# Patient Record
Sex: Female | Born: 1935
Health system: Southern US, Community
[De-identification: ages and names within clinical notes are randomized; demographics above are authoritative.]

## PROBLEM LIST (undated history)

## (undated) DIAGNOSIS — I5032 Chronic diastolic (congestive) heart failure: Secondary | ICD-10-CM

## (undated) DIAGNOSIS — E049 Nontoxic goiter, unspecified: Secondary | ICD-10-CM

## (undated) DIAGNOSIS — K2100 Gastro-esophageal reflux disease with esophagitis, without bleeding: Secondary | ICD-10-CM

## (undated) DIAGNOSIS — R0602 Shortness of breath: Secondary | ICD-10-CM

## (undated) DIAGNOSIS — I10 Essential (primary) hypertension: Secondary | ICD-10-CM

## (undated) DIAGNOSIS — K56609 Unspecified intestinal obstruction, unspecified as to partial versus complete obstruction: Secondary | ICD-10-CM

## (undated) DIAGNOSIS — R5383 Other fatigue: Secondary | ICD-10-CM

## (undated) DIAGNOSIS — I509 Heart failure, unspecified: Secondary | ICD-10-CM

## (undated) HISTORY — DX: Unspecified intestinal obstruction, unspecified as to partial versus complete obstruction: K56.609

## (undated) HISTORY — DX: Other fatigue: R53.83

## (undated) HISTORY — DX: Nontoxic goiter, unspecified: E04.9

## (undated) HISTORY — PX: THYROIDECTOMY: SHX17

## (undated) HISTORY — PX: HERNIA REPAIR: SHX51

## (undated) HISTORY — DX: Gastro-esophageal reflux disease with esophagitis, without bleeding: K21.00

## (undated) HISTORY — DX: Essential (primary) hypertension: I10

## (undated) HISTORY — PX: APPENDECTOMY: SHX54

## (undated) HISTORY — PX: CATARACT EXTRACTION, BILATERAL: SHX1313

## (undated) HISTORY — DX: Shortness of breath: R06.02

## (undated) HISTORY — DX: Heart failure, unspecified: I50.9

## (undated) HISTORY — PX: SMALL INTESTINE SURGERY: SHX150

---
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
## 2007-01-04 ENCOUNTER — Emergency Department (HOSPITAL_COMMUNITY): Admission: EM | Admit: 2007-01-04 | Discharge: 2007-01-04 | Payer: Self-pay | Admitting: Emergency Medicine

## 2007-01-05 ENCOUNTER — Inpatient Hospital Stay (HOSPITAL_COMMUNITY): Admission: EM | Admit: 2007-01-05 | Discharge: 2007-01-09 | Payer: Self-pay | Admitting: Emergency Medicine

## 2010-11-04 ENCOUNTER — Other Ambulatory Visit: Payer: Self-pay | Admitting: Ophthalmology

## 2010-11-04 ENCOUNTER — Encounter (HOSPITAL_COMMUNITY): Payer: Medicare Other

## 2010-11-04 LAB — HEMOGLOBIN AND HEMATOCRIT, BLOOD
HCT: 43.1 % (ref 36.0–46.0)
Hemoglobin: 14.3 g/dL (ref 12.0–15.0)

## 2010-11-04 LAB — BASIC METABOLIC PANEL
BUN: 8 mg/dL (ref 6–23)
Calcium: 9.7 mg/dL (ref 8.4–10.5)
GFR calc non Af Amer: 59 mL/min — ABNORMAL LOW (ref >60)
Glucose, Bld: 122 mg/dL — ABNORMAL HIGH (ref 70–99)
Sodium: 138 mEq/L (ref 135–145)

## 2010-11-11 ENCOUNTER — Ambulatory Visit (HOSPITAL_COMMUNITY)
Admission: RE | Admit: 2010-11-11 | Discharge: 2010-11-11 | Disposition: A | Discharge status: Home / Self Care | Payer: Medicare Other | Source: Ambulatory Visit | Attending: Ophthalmology | Admitting: Ophthalmology

## 2010-11-11 DIAGNOSIS — Z01818 Encounter for other preprocedural examination: Secondary | ICD-10-CM | POA: Insufficient documentation

## 2010-11-11 DIAGNOSIS — Z01812 Encounter for preprocedural laboratory examination: Secondary | ICD-10-CM | POA: Insufficient documentation

## 2010-11-11 DIAGNOSIS — H2510 Age-related nuclear cataract, unspecified eye: Secondary | ICD-10-CM | POA: Insufficient documentation

## 2011-01-14 ENCOUNTER — Encounter (HOSPITAL_COMMUNITY): Payer: Medicare Other

## 2011-01-14 ENCOUNTER — Other Ambulatory Visit: Payer: Self-pay | Admitting: Ophthalmology

## 2011-01-14 LAB — DIFFERENTIAL
Basophils Absolute: 0.1 10*3/uL (ref 0.0–0.1)
Basophils Relative: 1 % (ref 0–1)
Neutro Abs: 2.9 10*3/uL (ref 1.7–7.7)
Neutrophils Relative %: 43 % (ref 43–77)

## 2011-01-14 LAB — CBC
Hemoglobin: 13.3 g/dL (ref 12.0–15.0)
MCHC: 33 g/dL (ref 30.0–36.0)
RDW: 15.1 % (ref 11.5–15.5)
WBC: 6.8 10*3/uL (ref 4.0–10.5)

## 2011-01-14 LAB — BASIC METABOLIC PANEL
GFR calc non Af Amer: 53 mL/min — ABNORMAL LOW (ref >60)
Potassium: 4.2 mEq/L (ref 3.5–5.1)
Sodium: 139 mEq/L (ref 135–145)

## 2011-01-20 ENCOUNTER — Ambulatory Visit (HOSPITAL_COMMUNITY)
Admission: RE | Admit: 2011-01-20 | Discharge: 2011-01-20 | Disposition: A | Discharge status: Home / Self Care | Payer: Medicare Other | Source: Ambulatory Visit | Attending: Ophthalmology | Admitting: Ophthalmology

## 2011-01-20 DIAGNOSIS — H2510 Age-related nuclear cataract, unspecified eye: Secondary | ICD-10-CM | POA: Insufficient documentation

## 2011-01-20 DIAGNOSIS — I10 Essential (primary) hypertension: Secondary | ICD-10-CM | POA: Insufficient documentation

## 2011-01-20 DIAGNOSIS — Z79899 Other long term (current) drug therapy: Secondary | ICD-10-CM | POA: Insufficient documentation

## 2015-07-05 ENCOUNTER — Ambulatory Visit (HOSPITAL_COMMUNITY)
Admission: RE | Admit: 2015-07-05 | Discharge: 2015-07-05 | Disposition: A | Discharge status: Home / Self Care | Payer: Medicare PPO | Source: Ambulatory Visit | Attending: Pulmonary Disease | Admitting: Pulmonary Disease

## 2015-07-05 ENCOUNTER — Other Ambulatory Visit (HOSPITAL_COMMUNITY): Payer: Self-pay | Admitting: Pulmonary Disease

## 2015-07-05 DIAGNOSIS — R05 Cough: Secondary | ICD-10-CM

## 2015-07-05 DIAGNOSIS — R059 Cough, unspecified: Secondary | ICD-10-CM

## 2015-07-05 DIAGNOSIS — Z87891 Personal history of nicotine dependence: Secondary | ICD-10-CM | POA: Insufficient documentation

## 2015-07-05 DIAGNOSIS — J9811 Atelectasis: Secondary | ICD-10-CM | POA: Diagnosis not present

## 2015-07-05 DIAGNOSIS — R0602 Shortness of breath: Secondary | ICD-10-CM | POA: Diagnosis not present

## 2015-07-05 DIAGNOSIS — R5383 Other fatigue: Secondary | ICD-10-CM | POA: Insufficient documentation

## 2015-07-16 ENCOUNTER — Ambulatory Visit (INDEPENDENT_AMBULATORY_CARE_PROVIDER_SITE_OTHER): Payer: Medicare PPO | Admitting: Cardiology

## 2015-07-16 ENCOUNTER — Encounter: Payer: Self-pay | Admitting: Cardiology

## 2015-07-16 VITALS — BP 116/74 | HR 83 | Ht 65.0 in | Wt 186.0 lb

## 2015-07-16 DIAGNOSIS — R0602 Shortness of breath: Secondary | ICD-10-CM

## 2015-07-16 DIAGNOSIS — Z23 Encounter for immunization: Secondary | ICD-10-CM

## 2015-07-16 DIAGNOSIS — R011 Cardiac murmur, unspecified: Secondary | ICD-10-CM | POA: Diagnosis not present

## 2015-07-16 MED ORDER — AMLODIPINE BESYLATE 10 MG PO TABS: 10.0000 mg | ORAL_TABLET | Freq: Every day | ORAL | Status: DC

## 2015-07-16 NOTE — Progress Notes (Signed)
Patient ID: Susan Beck, female   DOB: March 02, 1932, 81 y.o.   MRN: [id number]     Clinical Summary Susan Beck is a 81 y.o.female seen today as a new patient for the following medical problems.  1. SOB - notes overall decrease in energy level along with SOB/DOE with acitivites - started approx 6 months ago. Denies any chest pain. Notes some occasional palpitations, lasts just a few minutes. Can occur a few times a month. No LE edema. Notes some SOB with laying flat. -DOE at 1/2 block on level ground. More significant symptoms walking incline - prior history of tobacco abuse x 40 years, quit in 2000  2. Bradycardia - heart rate 48 by ekg in pcp office - she has been on verapamil for several years - no lgihtheadness or dizziness  3. OSA screen - + snoring, no reported apneic episodes. + daytime somnolence.    PMH 1. HTN   No Known Allergies   Current Outpatient Prescriptions  Medication Sig Dispense Refill  . omeprazole (PRILOSEC) 20 MG capsule Take 20 mg by mouth daily.     . verapamil (CALAN-SR) 180 MG CR tablet 180 mg 2 (two) times daily.      No current facility-administered medications for this visit.     No past surgical history on file.   No Known Allergies    Denies family history of heart disease   Social History + tobacco history x 40 years, quite around 2000. No EtoH or drug use  Review of Systems CONSTITUTIONAL: No weight loss, fever, chills, weakness or fatigue.  HEENT: Eyes: No visual loss, blurred vision, double vision or yellow sclerae.No hearing loss, sneezing, congestion, runny nose or sore throat.  SKIN: No rash or itching.  CARDIOVASCULAR: per HPI RESPIRATORY: No  cough or sputum.  GASTROINTESTINAL: No anorexia, nausea, vomiting or diarrhea. No abdominal pain or blood.  GENITOURINARY: No burning on urination, no polyuria NEUROLOGICAL: No headache, dizziness, syncope, paralysis, ataxia, numbness or tingling in the extremities. No change in  bowel or bladder control.  MUSCULOSKELETAL: No muscle, back pain, joint pain or stiffness.  LYMPHATICS: No enlarged nodes. No history of splenectomy.  PSYCHIATRIC: No history of depression or anxiety.  ENDOCRINOLOGIC: No reports of sweating, cold or heat intolerance. No polyuria or polydipsia.  Marland Kitchen   Physical Examination Filed Vitals:   07/16/15 0839  BP: 116/74  Pulse: 83   Filed Vitals:   07/16/15 0839  Height: 5\' 5"  (1.651 m)  Weight: 186 lb (84.369 kg)    Gen: resting comfortably, no acute distress HEENT: no scleral icterus, pupils equal round and reactive, no palptable cervical adenopathy,  CV: RRR, 2/6 systolic murmur, no jvd Resp: Clear to auscultation bilaterally GI: abdomen is soft, non-tender, non-distended, normal bowel sounds, no hepatosplenomegaly MSK: extremities are warm, no edema.  Skin: warm, no rash Neuro:  no focal deficits Psych: appropriate affect     Assessment and Plan  1. SOB - overall generalized fatigue along with DOE with activities - in setting of heart murmur and symptom will obtian echo - sinus brady to 48 by ekg done at her pcp office, normal heart rate here. We will change verapamil to norvasc 10mg  daily and follow symptoms.  - long tobacco history, if negative cardiac workup consider PFTs  2. HTN - change verapamil to norvasc due to bradycardia.       Arnoldo Lenis, M.D

## 2015-07-16 NOTE — Patient Instructions (Signed)
Medication Instructions:  STOP VERAPAMIL START NORVASC 10 MG DAILY  Labwork: NONE  Testing/Procedures: Your physician has requested that you have an echocardiogram. Echocardiography is a painless test that uses sound waves to create images of your heart. It provides your doctor with information about the size and shape of your heart and how well your heart's chambers and valves are working. This procedure takes approximately one hour. There are no restrictions for this procedure.    Follow-Up: Your physician recommends that you schedule a follow-up appointment in: [place] DR. BRANCH   Any Other Special Instructions Will Be Listed Below (If Applicable).     If you need a refill on your cardiac medications before your next appointment, please call your pharmacy.  Thanks for choosing [facility]!!!

## 2015-07-19 ENCOUNTER — Ambulatory Visit (HOSPITAL_COMMUNITY)
Admission: RE | Admit: 2015-07-19 | Discharge: 2015-07-19 | Disposition: A | Discharge status: Home / Self Care | Payer: Medicare PPO | Source: Ambulatory Visit | Attending: Cardiology | Admitting: Cardiology

## 2015-07-19 DIAGNOSIS — R011 Cardiac murmur, unspecified: Secondary | ICD-10-CM | POA: Diagnosis not present

## 2015-07-19 DIAGNOSIS — I10 Essential (primary) hypertension: Secondary | ICD-10-CM | POA: Diagnosis not present

## 2015-07-19 DIAGNOSIS — I358 Other nonrheumatic aortic valve disorders: Secondary | ICD-10-CM | POA: Diagnosis not present

## 2015-07-19 DIAGNOSIS — I517 Cardiomegaly: Secondary | ICD-10-CM | POA: Diagnosis not present

## 2015-07-23 ENCOUNTER — Telehealth: Payer: Self-pay | Admitting: *Deleted

## 2015-07-23 NOTE — Telephone Encounter (Signed)
Pt aware, confirmed 12/19 appt in [place]

## 2015-08-19 ENCOUNTER — Ambulatory Visit (INDEPENDENT_AMBULATORY_CARE_PROVIDER_SITE_OTHER): Payer: Medicare PPO | Admitting: Cardiology

## 2015-08-19 ENCOUNTER — Encounter: Payer: Self-pay | Admitting: Cardiology

## 2015-08-19 VITALS — BP 138/78 | HR 91 | Ht 65.0 in | Wt 187.0 lb

## 2015-08-19 DIAGNOSIS — I10 Essential (primary) hypertension: Secondary | ICD-10-CM | POA: Diagnosis not present

## 2015-08-19 DIAGNOSIS — R001 Bradycardia, unspecified: Secondary | ICD-10-CM

## 2015-08-19 DIAGNOSIS — R0602 Shortness of breath: Secondary | ICD-10-CM | POA: Diagnosis not present

## 2015-08-19 NOTE — Progress Notes (Signed)
Patient ID: Susan Beck, female   DOB: Apr 09, 1932, 81 y.o.   MRN: [id number]     Clinical Summary Susan Beck is a 81 y.o.female seen today for follow up of the following medical problems.   1. SOB -last visit she described overall decrease in energy level along with SOB/DOE with acitivites - started approx 6 months ago. Denies any chest pain. Notes some occasional palpitations, lasts just a few minutes. Can occur a few times a month. No LE edema. Notes some SOB with laying flat. -DOE at 1/2 block on level ground. More significant symptoms walking incline - prior history of tobacco abuse x 40 years, quit in 2000. Do not see PFTs in our system  - since last visit she completed an echo that showed normal LVEF, mild diastolic dysfunction, and no significant valve pathology.  - symptoms somewhat improved since last visit, but over the last 3-4 weeks has been battling significant sinus congestion.     2. Bradycardia - heart rate 79 by ekg in pcp office last visit, we changed her verapamil to norvasc.  - heart rates normalized in clinic today.    PMH 1. HTN    No Known Allergies   Current Outpatient Prescriptions  Medication Sig Dispense Refill  . amLODipine (NORVASC) 10 MG tablet Take 1 tablet (10 mg total) by mouth daily. 180 tablet 3  . omeprazole (PRILOSEC) 20 MG capsule Take 20 mg by mouth daily.      No current facility-administered medications for this visit.     No past surgical history on file.   No Known Allergies    Denies any family history of heart disease   Social History Susan Beck reports that she quit smoking about 79 years ago. Her smoking use included Cigarettes. She started smoking about 61 years ago. She smoked 1.00 pack per day. She has never used smokeless tobacco. Susan Beck has no alcohol history on file.   Review of Systems CONSTITUTIONAL: No weight loss, fever, chills, weakness or fatigue.  HEENT: Eyes: No visual loss, blurred  vision, double vision or yellow sclerae.No hearing loss, sneezing, congestion, runny nose or sore throat.  SKIN: No rash or itching.  CARDIOVASCULAR: no chest pain, no palpitiatoins.  RESPIRATORY: +SOB  GASTROINTESTINAL: No anorexia, nausea, vomiting or diarrhea. No abdominal pain or blood.  GENITOURINARY: No burning on urination, no polyuria NEUROLOGICAL: No headache, dizziness, syncope, paralysis, ataxia, numbness or tingling in the extremities. No change in bowel or bladder control.  MUSCULOSKELETAL: No muscle, back pain, joint pain or stiffness.  LYMPHATICS: No enlarged nodes. No history of splenectomy.  PSYCHIATRIC: No history of depression or anxiety.  ENDOCRINOLOGIC: No reports of sweating, cold or heat intolerance. No polyuria or polydipsia.  Marland Kitchen   Physical Examination Filed Vitals:   08/19/15 1038  BP: 138/78  Pulse: 91   Filed Vitals:   08/19/15 1038  Height: 5\' 5"  (1.651 m)  Weight: 187 lb (84.823 kg)    Gen: resting comfortably, no acute distress HEENT: no scleral icterus, pupils equal round and reactive, no palptable cervical adenopathy,  CV: RRR, 2/6 systolic murmur RUSB, no jvd Resp: Clear to auscultation bilaterally GI: abdomen is soft, non-tender, non-distended, normal bowel sounds, no hepatosplenomegaly MSK: extremities are warm, no edema.  Skin: warm, no rash Neuro:  no focal deficits Psych: appropriate affect   Diagnostic Studies 07/2015 echo Study Conclusions  - Left ventricle: The cavity size was normal. Wall thickness was increased in a pattern of mild LVH. Systolic function  was normal. The estimated ejection fraction was in the range of 60% to 65%. Wall motion was normal; there were no regional wall motion abnormalities. Doppler parameters are consistent with abnormal left ventricular relaxation (grade 1 diastolic dysfunction). Indeterminate filling pressures. - Aortic valve: Aortic valve sclerosis without stenosis.  Mildly thickened, mildly calcified leaflets. - Left atrium: The atrium was mildly dilated. Volume/bsa, ES, (1-plane Simpson&'s, A2C): 30 ml/m^2.    Assessment and Plan  1. SOB - overall generalized fatigue along with DOE with activities - fairly normal echo. Given her tobacco history I would consider possible PFTs once her sinus issues have resolved, I think this is more likely than CAD as the etiology, however if PFTs are normal would consider stress testing - she is to call us when sinus issues improved so we can order PFTs  2. HTN - change verapamil to norvasc due to bradycardia last visit - bp and heart rates look good  3. Bradycardia - resolved off verapamil, continue norvasc for HTN  4. Sinus congestion - ongoing x 3-4 weeks, recommended trial of claritiin    F/u 3 months    Arnoldo Lenis, M.D

## 2015-08-19 NOTE — Patient Instructions (Signed)
Medication Instructions:  START OTC CLARITIN 10 MG DAILY AS NEEDED FOR CONGESTION   Labwork: NONE  Testing/Procedures: NONE  Follow-Up: Your physician recommends that you schedule a follow-up appointment in: 3 MONTHS WITH DR. BRANCH   Any Other Special Instructions Will Be Listed Below (If Applicable).     If you need a refill on your cardiac medications before your next appointment, please call your pharmacy.

## 2015-11-21 ENCOUNTER — Ambulatory Visit (INDEPENDENT_AMBULATORY_CARE_PROVIDER_SITE_OTHER): Payer: Medicare Other | Admitting: Cardiology

## 2015-11-21 ENCOUNTER — Encounter: Payer: Self-pay | Admitting: Cardiology

## 2015-11-21 VITALS — BP 136/70 | HR 92 | Ht 65.0 in | Wt 190.0 lb

## 2015-11-21 DIAGNOSIS — R0602 Shortness of breath: Secondary | ICD-10-CM | POA: Diagnosis not present

## 2015-11-21 DIAGNOSIS — R252 Cramp and spasm: Secondary | ICD-10-CM | POA: Diagnosis not present

## 2015-11-21 DIAGNOSIS — I10 Essential (primary) hypertension: Secondary | ICD-10-CM | POA: Diagnosis not present

## 2015-11-21 DIAGNOSIS — Z79899 Other long term (current) drug therapy: Secondary | ICD-10-CM | POA: Diagnosis not present

## 2015-11-21 LAB — MAGNESIUM: MAGNESIUM: 2.2 mg/dL (ref 1.5–2.5)

## 2015-11-21 LAB — BASIC METABOLIC PANEL
BUN: 9 mg/dL (ref 7–25)
CALCIUM: 9.9 mg/dL (ref 8.6–10.4)
CO2: 27 mmol/L (ref 20–31)
CREATININE: 0.86 mg/dL (ref 0.60–0.88)
Chloride: 104 mmol/L (ref 98–110)
GLUCOSE: 101 mg/dL — AB (ref 65–99)
Potassium: 4.4 mmol/L (ref 3.5–5.3)
SODIUM: 138 mmol/L (ref 135–146)

## 2015-11-21 NOTE — Progress Notes (Signed)
Patient ID: DAVIELLE HUTCHCRAFT, female   DOB: [birth year], 82 y.o.   MRN: [id number]     Clinical Summary Ms. Hottinger is a 82 y.o.female seen today for follow up of the following medical problems.   1. SOB -last visit she described overall decrease in energy level along with SOB/DOE with acitivites - started approx 6 months ago. Denies any chest pain. Notes some occasional palpitations, lasts just a few minutes. Can occur a few times a month. No LE edema. Notes some SOB with laying flat. -DOE at 1/2 block on level ground. More significant symptoms walking incline - prior history of tobacco abuse x 40 years, quit in 2000. Do not see PFTs in our system  - since last visit she completed an echo that showed normal LVEF, mild diastolic dysfunction, and no significant valve pathology.  - still with SOB with activity. Can have some occasional wheezing.    2. Dizziness - episode on Sunday morning, occurred with standing. Multiple episodes that day - reports normal oral intake. No other symptoms  4. Leg cramps - can occur in either calf, typically occurs while laying in bed. Does not occur with walking.    5. Leg swelling - bilateral LE edema started since starting norvasc  PMH 1. HTN    No Known Allergies   Current Outpatient Prescriptions  Medication Sig Dispense Refill  . amLODipine (NORVASC) 10 MG tablet Take 1 tablet (10 mg total) by mouth daily. 180 tablet 3  . omeprazole (PRILOSEC) 20 MG capsule Take 20 mg by mouth daily. Reported on 08/19/2015     No current facility-administered medications for this visit.       No Known Allergies    Family History She denies any family history of heart disease   Social History Ms. Woodlief reports that she quit smoking about 16 years ago. Her smoking use included Cigarettes. She started smoking about 61 years ago. She smoked 1.00 pack per day. She has never used smokeless tobacco. Ms. Bunce has no alcohol history on  file.   Review of Systems CONSTITUTIONAL: No weight loss, fever, chills, weakness or fatigue.  HEENT: Eyes: No visual loss, blurred vision, double vision or yellow sclerae.No hearing loss, sneezing, congestion, runny nose or sore throat.  SKIN: No rash or itching.  CARDIOVASCULAR: RRR, no m/r/g, no jvd RESPIRATORY: No shortness of breath, cough or sputum.  GASTROINTESTINAL: No anorexia, nausea, vomiting or diarrhea. No abdominal pain or blood.  GENITOURINARY: No burning on urination, no polyuria NEUROLOGICAL: No headache, dizziness, syncope, paralysis, ataxia, numbness or tingling in the extremities. No change in bowel or bladder control.  MUSCULOSKELETAL: leg cramps LYMPHATICS: No enlarged nodes. No history of splenectomy.  PSYCHIATRIC: No history of depression or anxiety.  ENDOCRINOLOGIC: No reports of sweating, cold or heat intolerance. No polyuria or polydipsia.  Marland Kitchen   Physical Examination Filed Vitals:   11/21/15 0947  BP: 136/70  Pulse: 92   Filed Vitals:   11/21/15 0947  Height: 5\' 5"  (1.651 m)  Weight: 190 lb (86.183 kg)    Gen: resting comfortably, no acute distress HEENT: no scleral icterus, pupils equal round and reactive, no palptable cervical adenopathy,  CV: RRR, no m/r/g, no jvd Resp: Clear to auscultation bilaterally GI: abdomen is soft, non-tender, non-distended, normal bowel sounds, no hepatosplenomegaly MSK: extremities are warm, 1+ biltaeral Edema Skin: warm, no rash Neuro:  no focal deficits Psych: appropriate affect   Diagnostic Studies 07/2015 echo Study Conclusions  - Left ventricle: The cavity size  was normal. Wall thickness was increased in a pattern of mild LVH. Systolic function was normal. The estimated ejection fraction was in the range of 60% to 65%. Wall motion was normal; there were no regional wall motion abnormalities. Doppler parameters are consistent with abnormal left ventricular relaxation (grade 1 diastolic  dysfunction). Indeterminate filling pressures. - Aortic valve: Aortic valve sclerosis without stenosis. Mildly thickened, mildly calcified leaflets. - Left atrium: The atrium was mildly dilated. Volume/bsa, ES, (1-plane Simpson&'s, A2C): 30 ml/m^2.    Assessment and Plan  1. SOB - overall generalized fatigue along with DOE with activities - fairly normal echo. Given her tobacco history will order PFTs - if negative PFTs, consider stress testing  2. HTN - some LE edema since starting norvasc, also with some dizziness over the weekend with standing suggesting orthostatic hypotension, though orthostatic vials in clinic are negative - follow swelling and dizziness with norvasc decreased to 5mg  daily.    3. Leg cramps - check K and Mg    Arnoldo Lenis, M.D.

## 2015-11-21 NOTE — Patient Instructions (Signed)
Medication Instructions:  DECREASE NORVASC TO 5 MG DAILY   Labwork: Your physician recommends that you return for lab work [id number]   Testing/Procedures: Your physician has recommended that you have a pulmonary function test. Pulmonary Function Tests are a group of tests that measure how well air moves in and out of your lungs.    Follow-Up: Your physician recommends that you schedule a follow-up appointment in:  2 MONTHS    Any Other Special Instructions Will Be Listed Below (If Applicable).     If you need a refill on your cardiac medications before your next appointment, please call your pharmacy.

## 2015-11-29 ENCOUNTER — Ambulatory Visit (HOSPITAL_COMMUNITY)
Admission: RE | Admit: 2015-11-29 | Discharge: 2015-11-29 | Disposition: A | Discharge status: Home / Self Care | Payer: Medicare Other | Source: Ambulatory Visit | Attending: Cardiology | Admitting: Cardiology

## 2015-11-29 DIAGNOSIS — R0602 Shortness of breath: Secondary | ICD-10-CM | POA: Diagnosis present

## 2015-11-29 LAB — PULMONARY FUNCTION TEST
DL/VA % PRED: 83 %
DL/VA: 4.12 ml/min/mmHg/L
DLCO UNC % PRED: 56 %
DLCO UNC: 14.48 ml/min/mmHg
FEF 25-75 POST: 1.34 L/s
FEF 25-75 PRE: 1.38 L/s
FEF2575-%CHANGE-POST: -3 %
FEF2575-%PRED-POST: 100 %
FEF2575-%Pred-Pre: 103 %
FEV1-%CHANGE-POST: 1 %
FEV1-%Pred-Post: 94 %
FEV1-%Pred-Pre: 92 %
FEV1-Post: 1.52 L
FEV1-Pre: 1.49 L
FEV1FVC-%Change-Post: 0 %
FEV1FVC-%PRED-PRE: 105 %
FEV6-%Change-Post: 2 %
FEV6-%PRED-POST: 97 %
FEV6-%Pred-Pre: 95 %
FEV6-POST: 1.94 L
FEV6-Pre: 1.89 L
FEV6FVC-%PRED-POST: 104 %
FEV6FVC-%Pred-Pre: 104 %
FVC-%CHANGE-POST: 2 %
FVC-%PRED-POST: 93 %
FVC-%PRED-PRE: 91 %
FVC-PRE: 1.89 L
FVC-Post: 1.94 L
POST FEV1/FVC RATIO: 78 %
PRE FEV1/FVC RATIO: 79 %
PRE FEV6/FVC RATIO: 100 %
Post FEV6/FVC ratio: 100 %
RV % pred: 112 %
RV: 2.77 L
TLC % pred: 90 %
TLC: 4.69 L

## 2015-11-29 MED ORDER — ALBUTEROL SULFATE (2.5 MG/3ML) 0.083% IN NEBU
2.5000 mg | INHALATION_SOLUTION | Freq: Once | RESPIRATORY_TRACT | Status: AC
  Administered 2015-11-29: 2.5 mg via RESPIRATORY_TRACT

## 2016-01-24 ENCOUNTER — Encounter: Payer: Self-pay | Admitting: Cardiology

## 2016-01-24 ENCOUNTER — Ambulatory Visit (INDEPENDENT_AMBULATORY_CARE_PROVIDER_SITE_OTHER): Payer: Medicare Other | Admitting: Cardiology

## 2016-01-24 VITALS — BP 158/78 | HR 80 | Ht 66.0 in | Wt 187.0 lb

## 2016-01-24 DIAGNOSIS — R079 Chest pain, unspecified: Secondary | ICD-10-CM | POA: Diagnosis not present

## 2016-01-24 DIAGNOSIS — R0602 Shortness of breath: Secondary | ICD-10-CM

## 2016-01-24 DIAGNOSIS — I10 Essential (primary) hypertension: Secondary | ICD-10-CM | POA: Diagnosis not present

## 2016-01-24 NOTE — Progress Notes (Signed)
Patient ID: Susan Beck, female   DOB: 29-Oct-1931, 82 y.o.   MRN: [id number]     Clinical Summary Susan Beck is a 82 y.o.female seen today for follow up of the following medical problems.   1. SOB -last visit she described overall decrease in energy level along with SOB/DOE with acitivites - started approx 6 months ago. Denies any chest pain. Notes some occasional palpitations, lasts just a few minutes. Can occur a few times a month. No LE edema. Notes some SOB with laying flat. -DOE at 1/2 block on level ground. More significant symptoms walking incline - prior history of tobacco abuse x 40 years, quit in 2000. Do not see PFTs in our system  -  she completed an echo that showed normal LVEF, mild diastolic dysfunction, and no significant valve pathology. PFTs were normal.  - still with SOB with activity.  DOE at 1/2 block  2. HTN - norvasc 10mg  caused dizziness and LE edema. Symptoms improved after decreasing norvasc to 5mg  daily.     PMH 1. HTN 2. GERD   No Known Allergies   Current Outpatient Prescriptions  Medication Sig Dispense Refill  . amLODipine (NORVASC) 10 MG tablet Take 1 tablet (10 mg total) by mouth daily. (Patient taking differently: Take 5 mg by mouth daily. ) 180 tablet 3  . Probiotic Product ([place]) CAPS Take by mouth.     No current facility-administered medications for this visit.     No past surgical history on file.   No Known Allergies    No family history on file.   Social History Susan Beck reports that she quit smoking about 16 years ago. Her smoking use included Cigarettes. She started smoking about 61 years ago. She smoked 1.00 pack per day. She has never used smokeless tobacco. Susan Beck has no alcohol history on file.   Review of Systems CONSTITUTIONAL: No weight loss, fever, chills, weakness or fatigue.  HEENT: Eyes: No visual loss, blurred vision, double vision or yellow sclerae.No hearing loss, sneezing,  congestion, runny nose or sore throat.  SKIN: No rash or itching.  CARDIOVASCULAR: no chest pain RESPIRATORY: per HPI  GASTROINTESTINAL: No anorexia, nausea, vomiting or diarrhea. No abdominal pain or blood.  GENITOURINARY: No burning on urination, no polyuria NEUROLOGICAL: No headache, dizziness, syncope, paralysis, ataxia, numbness or tingling in the extremities. No change in bowel or bladder control.  MUSCULOSKELETAL: No muscle, back pain, joint pain or stiffness.  LYMPHATICS: No enlarged nodes. No history of splenectomy.  PSYCHIATRIC: No history of depression or anxiety.  ENDOCRINOLOGIC: No reports of sweating, cold or heat intolerance. No polyuria or polydipsia.  Susan Beck   Physical Examination Filed Vitals:   01/24/16 0939  BP: 158/78  Pulse: 80   Filed Vitals:   01/24/16 0939  Height: 5\' 6"  (1.676 m)  Weight: 187 lb (84.823 kg)    Gen: resting comfortably, no acute distress HEENT: no scleral icterus, pupils equal round and reactive, no palptable cervical adenopathy,  CV: RRR, no m/r/g, no jvd Resp: Clear to auscultation bilaterally GI: abdomen is soft, non-tender, non-distended, normal bowel sounds, no hepatosplenomegaly MSK: extremities are warm, trace bilateral edema Skin: warm, no rash Neuro:  no focal deficits Psych: appropriate affect   Diagnostic Studies 07/2015 echo Study Conclusions  - Left ventricle: The cavity size was normal. Wall thickness was increased in a pattern of mild LVH. Systolic function was normal. The estimated ejection fraction was in the range of 60% to 65%. Wall motion  was normal; there were no regional wall motion abnormalities. Doppler parameters are consistent with abnormal left ventricular relaxation (grade 1 diastolic dysfunction). Indeterminate filling pressures. - Aortic valve: Aortic valve sclerosis without stenosis. Mildly thickened, mildly calcified leaflets. - Left atrium: The atrium was mildly dilated. Volume/bsa,  ES, (1-plane Simpson&'s, A2C): 30 ml/m^2.  10/2015 PFTs Normal   Assessment and Plan   1. SOB - overall generalized fatigue along with DOE with activities -EKG in clinic shows NSR, no ischemic changes.  fairly normal echo. PFTs unremarkable. We will obtain an exercise nuclear stress test with modified Bruce protocol to evaluate for possible ischemia as the etiology of her SOB and DOE.   2. HTN - bp elevated, she had significant orthostatic symptoms with more aggressive therapy. We will continue current meds    F/u 3 months   Susan Beck, M.D.

## 2016-01-24 NOTE — Patient Instructions (Signed)
Medication Instructions:  Your physician recommends that you continue on your current medications as directed. Please refer to the Current Medication list given to you today.   Labwork: NONE  Testing/Procedures: Your physician has requested that you have en exercise stress myoview. For further information please visit [email or web address]. Please follow instruction sheet, as given.    Follow-Up: Your physician recommends that you schedule a follow-up appointment in: 3 MONTHS     Any Other Special Instructions Will Be Listed Below (If Applicable).     If you need a refill on your cardiac medications before your next appointment, please call your pharmacy.   

## 2016-01-29 ENCOUNTER — Encounter (HOSPITAL_COMMUNITY)
Admission: RE | Admit: 2016-01-29 | Discharge: 2016-01-29 | Disposition: A | Discharge status: Home / Self Care | Payer: Medicare Other | Source: Ambulatory Visit | Attending: Cardiology | Admitting: Cardiology

## 2016-01-29 ENCOUNTER — Encounter (HOSPITAL_COMMUNITY): Payer: Self-pay

## 2016-01-29 ENCOUNTER — Inpatient Hospital Stay (HOSPITAL_COMMUNITY): Admission: RE | Admit: 2016-01-29 | Payer: Medicare Other | Source: Ambulatory Visit

## 2016-01-29 DIAGNOSIS — R079 Chest pain, unspecified: Secondary | ICD-10-CM

## 2016-01-29 LAB — NM MYOCAR MULTI W/SPECT W/WALL MOTION / EF
CHL CUP MPHR: 139 {beats}/min
CHL CUP NUCLEAR SDS: 1
CHL CUP NUCLEAR SRS: 0
CHL CUP RESTING HR STRESS: 59 {beats}/min
CHL RATE OF PERCEIVED EXERTION: 13
CSEPED: 3 min
CSEPEDS: 47 s
CSEPEW: 2.3 METS
LV dias vol: 66 mL (ref 46–106)
LV sys vol: 20 mL
Peak HR: 129 {beats}/min
Percent HR: 92 %
RATE: 0.31
SSS: 1
TID: 0.9

## 2016-01-29 MED ORDER — TECHNETIUM TC 99M TETROFOSMIN IV KIT
30.0000 | PACK | Freq: Once | INTRAVENOUS | Status: AC | PRN
  Administered 2016-01-29: 30 via INTRAVENOUS

## 2016-01-29 MED ORDER — SODIUM CHLORIDE 0.9% FLUSH
INTRAVENOUS | Status: AC
  Administered 2016-01-29: 10 mL via INTRAVENOUS
  Filled 2016-01-29: qty 10

## 2016-01-29 MED ORDER — TECHNETIUM TC 99M TETROFOSMIN IV KIT
10.0000 | PACK | Freq: Once | INTRAVENOUS | Status: AC | PRN
  Administered 2016-01-29: 10 via INTRAVENOUS

## 2016-01-29 MED ORDER — REGADENOSON 0.4 MG/5ML IV SOLN
INTRAVENOUS | Status: AC
  Filled 2016-01-29: qty 5

## 2016-05-05 ENCOUNTER — Encounter: Payer: Self-pay | Admitting: Cardiology

## 2016-05-05 ENCOUNTER — Ambulatory Visit (INDEPENDENT_AMBULATORY_CARE_PROVIDER_SITE_OTHER): Payer: Medicare Other | Admitting: Cardiology

## 2016-05-05 VITALS — BP 144/70 | HR 68 | Ht 65.0 in | Wt 183.0 lb

## 2016-05-05 DIAGNOSIS — R0602 Shortness of breath: Secondary | ICD-10-CM

## 2016-05-05 NOTE — Patient Instructions (Signed)

## 2016-05-05 NOTE — Progress Notes (Signed)
Clinical Summary Susan Beck is a 82 y.o.female seen today for follow up of the following medical problems.   1. SOB -last visit she described overall decrease in energy level along with SOB/DOE with acitivites - started approx 6 months ago. Denies any chest pain. Notes some occasional palpitations, lasts just a few minutes. Can occur a few times a month. No LE edema. Notes some SOB with laying flat. -DOE at 1/2 block on level ground. More significant symptoms walking incline  -  she completed an echo that showed normal LVEF, mild diastolic dysfunction, and no significant valve pathology. PFTs were normal.  - since last visit completed exercise nuclear stress modified Bruce protocol. She went 4 min and 2.3 METs, achieving her THR. No evidence of ischemia.  - echo with LVEF 60-65%, no WMAs, grade I diastolic dysfunction.   - has started increasing her exercise activity. Walking 3-4 days a week, symptoms of SOB are improving with increased activity. .   2. HTN - norvasc 10mg  caused dizziness and LE edema. Symptoms improved after decreasing norvasc to 5mg  daily.     PMH 1. HTN    No Known Allergies   Current Outpatient Prescriptions  Medication Sig Dispense Refill  . amLODipine (NORVASC) 10 MG tablet Take 1 tablet (10 mg total) by mouth daily. (Patient taking differently: Take 5 mg by mouth daily. ) 180 tablet 3  . omeprazole (PRILOSEC) 20 MG capsule Take 20 mg by mouth daily.    . Probiotic Product ([place]) CAPS Take by mouth.     No current facility-administered medications for this visit.         No Known Allergies     Social History Susan Beck reports that she quit smoking about 16 years ago. Her smoking use included Cigarettes. She started smoking about 61 years ago. She smoked 1.00 pack per day. She has never used smokeless tobacco. Susan Beck has no alcohol history on file.   Review of Systems CONSTITUTIONAL: No weight loss,  fever, chills, weakness or fatigue.  HEENT: Eyes: No visual loss, blurred vision, double vision or yellow sclerae.No hearing loss, sneezing, congestion, runny nose or sore throat.  SKIN: No rash or itching.  CARDIOVASCULAR: per HPI RESPIRATORY: Per HPI GASTROINTESTINAL: No anorexia, nausea, vomiting or diarrhea. No abdominal pain or blood.  GENITOURINARY: No burning on urination, no polyuria NEUROLOGICAL: No headache, dizziness, syncope, paralysis, ataxia, numbness or tingling in the extremities. No change in bowel or bladder control.  MUSCULOSKELETAL: No muscle, back pain, joint pain or stiffness.  LYMPHATICS: No enlarged nodes. No history of splenectomy.  PSYCHIATRIC: No history of depression or anxiety.  ENDOCRINOLOGIC: No reports of sweating, cold or heat intolerance. No polyuria or polydipsia.  Marland Kitchen   Physical Examination Vitals:   05/05/16 0957  BP: (!) 144/70  Pulse: 68   Vitals:   05/05/16 0957  Weight: 183 lb (83 kg)  Height: 5\' 5"  (1.651 m)    Gen: resting comfortably, no acute distress HEENT: no scleral icterus, pupils equal round and reactive, no palptable cervical adenopathy,  CV: RRR, no jvd Resp: Clear to auscultation bilaterally GI: abdomen is soft, non-tender, non-distended, normal bowel sounds, no hepatosplenomegaly MSK: extremities are warm, no edema.  Skin: warm, no rash Neuro:  no focal deficits Psych: appropriate affect   Diagnostic Studies 07/2015 echo Study Conclusions  - Left ventricle: The cavity size was normal. Wall thickness was increased in a pattern of mild LVH. Systolic function was normal. The  estimated ejection fraction was in the range of 60% to 65%. Wall motion was normal; there were no regional wall motion abnormalities. Doppler parameters are consistent with abnormal left ventricular relaxation (grade 1 diastolic dysfunction). Indeterminate filling pressures. - Aortic valve: Aortic valve sclerosis without stenosis.  Mildly thickened, mildly calcified leaflets. - Left atrium: The atrium was mildly dilated. Volume/bsa, ES, (1-plane Simpson&'s, A2C): 30 ml/m^2.  10/2015 PFTs Normal   12/2015 MPI  Blood pressure demonstrated a hypertensive response to exercise.  There was no ST segment deviation noted during stress.  The study is normal. There are no perfusion defects.  This is a low risk study.  The left ventricular ejection fraction is hyperdynamic (>65%).   Assessment and Plan  1. SOB - overall generalized fatigue along with DOE with activities -negative workup including echo, PFTs, and stress test - symptoms improving with increased exercise/activity, likely main component is deconditioning. Encouraged to continue exercise program.    F/u 1 year      Arnoldo Lenis, M.D.

## 2016-08-11 ENCOUNTER — Other Ambulatory Visit: Payer: Self-pay | Admitting: Cardiology

## 2016-09-22 ENCOUNTER — Other Ambulatory Visit: Payer: Self-pay | Admitting: Cardiology

## 2016-09-22 MED ORDER — AMLODIPINE BESYLATE 5 MG PO TABS: 5.0000 mg | ORAL_TABLET | Freq: Every day | ORAL | 1 refills | Status: DC

## 2016-09-22 NOTE — Telephone Encounter (Signed)
Pt is needing a new Rx for her amLODipine (NORVASC) 5 MG tablet [id number]  sent to [facility]. They have her Rx for 10mg  and she's been having to cut them in half.

## 2016-09-22 NOTE — Telephone Encounter (Signed)
Clarified with Dr Harl Bowie about pt's dose of Norvasc.He wants her to have 5 mg tablets dispensed as that is her correct dose.e-scribed to [facility]   Attempted to reach pt to tell her this and got answering machine.

## 2016-09-23 NOTE — Telephone Encounter (Signed)
Patient notified of refill and voiced understanding

## 2017-03-31 ENCOUNTER — Other Ambulatory Visit: Payer: Self-pay | Admitting: Cardiology

## 2017-07-02 ENCOUNTER — Other Ambulatory Visit: Payer: Self-pay | Admitting: Cardiology

## 2017-10-04 ENCOUNTER — Other Ambulatory Visit: Payer: Self-pay | Admitting: Cardiology

## 2017-12-31 ENCOUNTER — Other Ambulatory Visit: Payer: Self-pay | Admitting: Cardiology

## 2018-01-12 ENCOUNTER — Other Ambulatory Visit (HOSPITAL_COMMUNITY): Payer: Self-pay | Admitting: Pulmonary Disease

## 2018-01-12 DIAGNOSIS — E049 Nontoxic goiter, unspecified: Secondary | ICD-10-CM

## 2018-01-18 ENCOUNTER — Ambulatory Visit (HOSPITAL_COMMUNITY)
Admission: RE | Admit: 2018-01-18 | Discharge: 2018-01-18 | Disposition: A | Discharge status: Home / Self Care | Payer: Medicare Other | Source: Ambulatory Visit | Attending: Pulmonary Disease | Admitting: Pulmonary Disease

## 2018-01-18 DIAGNOSIS — E890 Postprocedural hypothyroidism: Secondary | ICD-10-CM | POA: Insufficient documentation

## 2018-01-18 DIAGNOSIS — E049 Nontoxic goiter, unspecified: Secondary | ICD-10-CM

## 2018-01-18 DIAGNOSIS — E041 Nontoxic single thyroid nodule: Secondary | ICD-10-CM | POA: Diagnosis not present

## 2018-02-10 ENCOUNTER — Ambulatory Visit (INDEPENDENT_AMBULATORY_CARE_PROVIDER_SITE_OTHER): Payer: Medicare Other | Admitting: Otolaryngology

## 2018-02-10 DIAGNOSIS — H608X3 Other otitis externa, bilateral: Secondary | ICD-10-CM

## 2018-02-10 DIAGNOSIS — H903 Sensorineural hearing loss, bilateral: Secondary | ICD-10-CM

## 2019-01-17 LAB — LIPID PANEL
Cholesterol: 190 (ref 0–200)
HDL: 46 (ref 35–70)
LDL Cholesterol: 105
Triglycerides: 94 (ref 40–160)

## 2019-01-17 LAB — CBC AND DIFFERENTIAL
HCT: 44 (ref 36–46)
Hemoglobin: 15.2 (ref 12.0–16.0)
Neutrophils Absolute: 5
Platelets: 326 (ref 150–399)
WBC: 8.6

## 2019-01-17 LAB — BASIC METABOLIC PANEL
BUN: 10 (ref 4–21)
CO2: 24 — AB (ref 13–22)
Chloride: 102 (ref 99–108)
Creatinine: 0.9 (ref <=1.1)
Glucose: 109
Potassium: 4.3 (ref 3.4–5.3)
Sodium: 141 (ref 137–147)

## 2019-01-17 LAB — HEPATIC FUNCTION PANEL
ALT: 23 (ref 7–35)
AST: 21 (ref 13–35)
Alkaline Phosphatase: 66 (ref 25–125)

## 2019-01-17 LAB — COMPREHENSIVE METABOLIC PANEL
Albumin: 4.5 (ref 3.5–5.0)
Calcium: 10.2 (ref 8.7–10.7)
GFR calc Af Amer: 67
GFR calc non Af Amer: 58

## 2019-01-17 LAB — TSH: TSH: 2.49 (ref <=5.90)

## 2019-01-17 LAB — CBC: RBC: 5 (ref 3.87–5.11)

## 2019-01-18 ENCOUNTER — Other Ambulatory Visit: Payer: Self-pay | Admitting: Pulmonary Disease

## 2019-01-18 DIAGNOSIS — E041 Nontoxic single thyroid nodule: Secondary | ICD-10-CM

## 2019-01-25 ENCOUNTER — Ambulatory Visit (HOSPITAL_COMMUNITY)
Admission: RE | Admit: 2019-01-25 | Discharge: 2019-01-25 | Disposition: A | Discharge status: Home / Self Care | Payer: Medicare Other | Source: Ambulatory Visit | Attending: Pulmonary Disease | Admitting: Pulmonary Disease

## 2019-01-25 ENCOUNTER — Other Ambulatory Visit: Payer: Self-pay

## 2019-01-25 DIAGNOSIS — E041 Nontoxic single thyroid nodule: Secondary | ICD-10-CM | POA: Diagnosis not present

## 2019-02-16 ENCOUNTER — Ambulatory Visit (INDEPENDENT_AMBULATORY_CARE_PROVIDER_SITE_OTHER): Payer: Medicare Other | Admitting: Otolaryngology

## 2019-02-16 DIAGNOSIS — H903 Sensorineural hearing loss, bilateral: Secondary | ICD-10-CM

## 2019-07-29 DIAGNOSIS — E049 Nontoxic goiter, unspecified: Secondary | ICD-10-CM | POA: Insufficient documentation

## 2019-07-29 DIAGNOSIS — R5383 Other fatigue: Secondary | ICD-10-CM | POA: Insufficient documentation

## 2019-07-29 DIAGNOSIS — I10 Essential (primary) hypertension: Secondary | ICD-10-CM | POA: Insufficient documentation

## 2019-07-29 DIAGNOSIS — K2100 Gastro-esophageal reflux disease with esophagitis, without bleeding: Secondary | ICD-10-CM | POA: Insufficient documentation

## 2019-07-29 DIAGNOSIS — I152 Hypertension secondary to endocrine disorders: Secondary | ICD-10-CM | POA: Insufficient documentation

## 2019-07-29 DIAGNOSIS — I5032 Chronic diastolic (congestive) heart failure: Secondary | ICD-10-CM | POA: Insufficient documentation

## 2019-07-29 DIAGNOSIS — R0602 Shortness of breath: Secondary | ICD-10-CM | POA: Insufficient documentation

## 2019-09-01 DIAGNOSIS — Z923 Personal history of irradiation: Secondary | ICD-10-CM

## 2019-09-01 HISTORY — DX: Personal history of irradiation: Z92.3

## 2019-10-31 ENCOUNTER — Ambulatory Visit: Payer: Medicare PPO | Attending: Internal Medicine

## 2019-10-31 DIAGNOSIS — Z23 Encounter for immunization: Secondary | ICD-10-CM | POA: Insufficient documentation

## 2019-10-31 NOTE — Progress Notes (Signed)
Patient received Moderna COVID Vaccine  Lot number [id number].  Pfizer was documented in error.  

## 2019-11-28 ENCOUNTER — Ambulatory Visit: Payer: Medicare PPO | Attending: Internal Medicine

## 2019-11-28 DIAGNOSIS — Z23 Encounter for immunization: Secondary | ICD-10-CM

## 2019-11-28 NOTE — Progress Notes (Signed)
   Covid-19 Vaccination Clinic  Name:  Susan Beck    MRN: [id number] DOB: [birth year]  11/28/2019  [name] was observed post Covid-19 immunization for 15 minutes without incident. She was provided with Vaccine Information Sheet and instruction to access the V-Safe system.   [name] was instructed to call 911 with any severe reactions post vaccine: Marland Kitchen Difficulty breathing  . Swelling of face and throat  . A fast heartbeat  . A bad rash all over body  . Dizziness and weakness   Immunizations Administered    Name Date Dose VIS Date Route   Moderna COVID-19 Vaccine 11/28/2019 10:35 AM 0.5 mL 08/01/2019 Intramuscular   Manufacturer: Moderna   Lot: [id number]   [id number]

## 2020-01-03 ENCOUNTER — Ambulatory Visit: Payer: Medicare Other | Admitting: Family Medicine

## 2020-01-17 ENCOUNTER — Other Ambulatory Visit (HOSPITAL_COMMUNITY): Payer: Self-pay | Admitting: Internal Medicine

## 2020-01-17 DIAGNOSIS — N63 Unspecified lump in unspecified breast: Secondary | ICD-10-CM

## 2020-01-30 ENCOUNTER — Ambulatory Visit (HOSPITAL_COMMUNITY)
Admission: RE | Admit: 2020-01-30 | Discharge: 2020-01-30 | Disposition: A | Discharge status: Home / Self Care | Payer: Medicare PPO | Source: Ambulatory Visit | Attending: Internal Medicine | Admitting: Internal Medicine

## 2020-01-30 ENCOUNTER — Encounter (HOSPITAL_COMMUNITY): Payer: Medicare PPO

## 2020-01-30 ENCOUNTER — Other Ambulatory Visit: Payer: Self-pay

## 2020-01-30 DIAGNOSIS — N63 Unspecified lump in unspecified breast: Secondary | ICD-10-CM

## 2020-01-30 DIAGNOSIS — N6321 Unspecified lump in the left breast, upper outer quadrant: Secondary | ICD-10-CM | POA: Diagnosis not present

## 2020-01-31 ENCOUNTER — Other Ambulatory Visit (HOSPITAL_COMMUNITY): Payer: Self-pay | Admitting: Internal Medicine

## 2020-01-31 DIAGNOSIS — N6320 Unspecified lump in the left breast, unspecified quadrant: Secondary | ICD-10-CM

## 2020-02-13 ENCOUNTER — Ambulatory Visit (HOSPITAL_COMMUNITY)
Admission: RE | Admit: 2020-02-13 | Discharge: 2020-02-13 | Disposition: A | Discharge status: Home / Self Care | Payer: Medicare PPO | Source: Ambulatory Visit | Attending: Internal Medicine | Admitting: Internal Medicine

## 2020-02-13 ENCOUNTER — Other Ambulatory Visit: Payer: Self-pay

## 2020-02-13 ENCOUNTER — Other Ambulatory Visit (HOSPITAL_COMMUNITY): Payer: Self-pay | Admitting: Internal Medicine

## 2020-02-13 DIAGNOSIS — C50412 Malignant neoplasm of upper-outer quadrant of left female breast: Secondary | ICD-10-CM | POA: Diagnosis not present

## 2020-02-13 DIAGNOSIS — R928 Other abnormal and inconclusive findings on diagnostic imaging of breast: Secondary | ICD-10-CM

## 2020-02-13 DIAGNOSIS — N6320 Unspecified lump in the left breast, unspecified quadrant: Secondary | ICD-10-CM

## 2020-02-13 HISTORY — PX: BREAST BIOPSY: SHX20

## 2020-02-13 MED ORDER — LIDOCAINE HCL (PF) 2 % IJ SOLN
INTRAMUSCULAR | Status: AC
  Filled 2020-02-13: qty 20

## 2020-02-13 MED ORDER — LIDOCAINE-EPINEPHRINE (PF) 1 %-1:200000 IJ SOLN
INTRAMUSCULAR | Status: AC
  Filled 2020-02-13: qty 30

## 2020-02-29 ENCOUNTER — Encounter: Payer: Self-pay | Admitting: General Surgery

## 2020-02-29 ENCOUNTER — Ambulatory Visit: Payer: Medicare PPO | Admitting: General Surgery

## 2020-02-29 ENCOUNTER — Other Ambulatory Visit: Payer: Self-pay

## 2020-02-29 VITALS — BP 178/101 | HR 85 | Temp 97.6°F | Resp 18 | Ht 65.0 in | Wt 192.0 lb

## 2020-02-29 DIAGNOSIS — C50412 Malignant neoplasm of upper-outer quadrant of left female breast: Secondary | ICD-10-CM | POA: Diagnosis not present

## 2020-02-29 NOTE — Patient Instructions (Signed)
Total or Modified Radical Mastectomy, Care After This sheet gives you information about how to care for yourself after your procedure. Your health care provider may also give you more specific instructions. If you have problems or questions, contact your health care provider. What can I expect after the procedure? After the procedure, it is common to have:  Pain.  Numbness.  Stiffness in the arm or shoulder.  Feelings of stress, sadness, or depression. If the lymph nodes under your arm were removed, you may have arm swelling, weakness, or numbness on the same side of your body as your surgery. Follow these instructions at home: Incision care   Follow instructions from your health care provider about how to take care of your incision. Make sure you: ? Wash your hands with soap and water before you change your bandage (dressing). If soap and water are not available, use hand sanitizer. ? Change your dressing as told by your health care provider. ? Leave stitches (sutures), skin glue, or adhesive strips in place. These skin closures may need to stay in place for 2 weeks or longer. If adhesive strip edges start to loosen and curl up, you may trim the loose edges. Do not remove adhesive strips completely unless your health care provider tells you to do that.  Check your incision area every day for signs of infection. Check for: ? Redness, swelling, or more pain. ? Fluid or blood. ? Warmth. ? Pus or a bad smell.  If you were sent home with a surgical drain in place, follow instructions from your health care provider about emptying it. Bathing  Do not take baths, swim, or use a hot tub until your health care provider approves. Ask your health care provider if you may take showers. You may only be allowed to take sponge baths. Activity  Return to your normal activities as told by your health care provider. Ask your health care provider what activities are safe for you.  Avoid activities  that take a lot of effort.  Be careful to avoid any activities that could cause an injury to your arm on the side of your surgery.  Do not lift anything that is heavier than 10 lb (4.5 kg), or the limit that you are told, until your health care provider says that it is safe.  Avoid lifting with the arm on the side of your surgery.  Do not carry heavy objects on your shoulder.  After your drain is removed, do exercises to prevent stiffness and swelling in your arm. Talk with your health care provider about which exercises are safe for you. General instructions  Take over-the-counter and prescription medicines only as told by your health care provider.  You may eat what you usually do.  Keep your arm raised (elevated) above the level of your heart when you are sitting or lying down.  Do not wear tight jewelry on your arm, wrist, or fingers on the side of your surgery.  You may be given a tight sleeve (compression bandage) to wear over your arm on the side of your surgery. Wear this sleeve as told by your health care provider.  Ask your health care provider when you can start wearing a bra or using a breast prosthesis.  Before you are involved in certain procedures such as giving blood or having your blood pressure checked, tell all your health care providers if lymph nodes under your arm were removed. This is important information. Follow-up  Keep all follow-up visits  as told by your health care provider. This is important.  Get checked for extra fluid around your lymph nodes (lymphedema) as often as told by your health care provider. Contact a health care provider if:  You have a fever.  Your pain medicine is not working.  Your arm swelling, weakness, or numbness has not improved after a few weeks.  You have new swelling in your breast area or arm.  You have redness, swelling, or more pain in your incision area.  You have fluid or blood coming from your incision.  Your  incision feels warm to the touch.  You have pus or a bad smell coming from your incision. Get help right away if:  You have very bad pain in your breast area or arm.  You have chest pain.  You have difficulty breathing. Summary  Follow instructions from your health care provider about how to take care of your incision. Check your incision area every day for signs of infection.  Ask your health care provider what activities are safe for you.  Keep all follow-up visits as told by your health care provider. This is important.  Make sure you know which symptoms should cause you to contact your health care provider or to get help right away. This information is not intended to replace advice given to you by your health care provider. Make sure you discuss any questions you have with your health care provider. Document Revised: 10/21/2018 Document Reviewed: 05/21/2017 Elsevier Patient Education  2020 [employer].

## 2020-03-01 NOTE — Progress Notes (Signed)
Susan Beck; [id number]; [birth year]   HPI Patient is an 86 year old black female who was referred to my care by Dr. Legrand Rams in the breast center for newly diagnosed left lobular invasive carcinoma.  Patient noticed a lump in her left breast several months ago.  She underwent mammography and biopsy of 2 areas in the left breast.  Both were positive for lobular carcinoma.  A biopsy of the left axillary lymph node was negative.  Patient states she has a sister with breast cancer, diagnosed in her 18s.  She denies any left breast pain.  No nipple discharge has been noted. Patient reports some shortness of breath when walking a short distance from an office to the parking lot.  She denies any chest pain. Past Medical History:  Diagnosis Date   Bowel obstruction (HCC)    Chronic diastolic (congestive) heart failure (HCC)    Essential (primary) hypertension    Gastro-esophageal reflux disease with esophagitis    Nontoxic goiter, unspecified    Other fatigue    Shortness of breath     Past Surgical History:  Procedure Laterality Date   APPENDECTOMY     HERNIA REPAIR      History reviewed. No pertinent family history.  Current Outpatient Medications on File Prior to Visit  Medication Sig Dispense Refill   amLODipine (NORVASC) 5 MG tablet TAKE ONE TABLET BY MOUTH ONCE DAILY. 30 tablet 0   omeprazole (PRILOSEC) 20 MG capsule Take 20 mg by mouth daily.     Probiotic Product ([place]) CAPS Take by mouth.     No current facility-administered medications on file prior to visit.    No Known Allergies  Social History   Substance and Sexual Activity  Alcohol Use None    Social History   Tobacco Use  Smoking Status Former Smoker   Packs/day: 1.00   Types: Cigarettes   Start date: 07/15/1954   Quit date: 07/16/1999   Years since quitting: 20.6  Smokeless Tobacco Never Used    Review of Systems  Constitutional: Negative.   HENT: Negative.   Eyes:  Negative.   Respiratory: Positive for shortness of breath.   Cardiovascular: Negative.   Gastrointestinal: Negative.   Genitourinary: Positive for frequency.  Musculoskeletal: Negative.   Skin: Negative.   Neurological: Negative.   Endo/Heme/Allergies: Negative.   Psychiatric/Behavioral: Negative.     Objective   Vitals:   02/29/20 1054  BP: (!) 178/101  Pulse: 85  Resp: 18  Temp: 97.6 F (36.4 C)  SpO2: 95%    Physical Exam Vitals reviewed.  Constitutional:      Appearance: Normal appearance. She is not ill-appearing.  HENT:     Head: Normocephalic and atraumatic.  Cardiovascular:     Rate and Rhythm: Normal rate and regular rhythm.     Heart sounds: Normal heart sounds. No murmur heard.  No friction rub. No gallop.   Pulmonary:     Effort: Pulmonary effort is normal. No respiratory distress.     Breath sounds: Normal breath sounds. No stridor. No wheezing, rhonchi or rales.  Lymphadenopathy:     Cervical: No cervical adenopathy.  Skin:    General: Skin is warm and dry.  Neurological:     Mental Status: She is alert and oriented to person, place, and time.   Breast: Left nipple retraction noted.  Dominant masses x2 in upper and lower outer quadrants of the left breast.  Some bruising is noted from the biopsies.  Masses measure approximately 3  to 4 cm in size, though it is difficult to fully assess the size.  Left axilla is negative for palpable nodes.  Right breast examination unremarkable.  Mammography and biopsy reports reviewed  Assessment  Invasive lobular carcinoma of left breast in multiple quadrants Shortness of breath Plan   Patient will need left modified radical mastectomy.  The risks and benefits of the procedure including bleeding, infection, cardiopulmonary difficulties, and the possibility of a blood transfusion were fully explained to the patient, who gave informed consent.  I will get cardiology clearance prior to surgery due to her shortness of  breath.

## 2020-03-11 ENCOUNTER — Other Ambulatory Visit: Payer: Self-pay

## 2020-03-11 ENCOUNTER — Encounter: Payer: Self-pay | Admitting: Cardiology

## 2020-03-11 ENCOUNTER — Ambulatory Visit: Payer: Medicare PPO | Admitting: Cardiology

## 2020-03-11 VITALS — BP 150/82 | HR 74 | Ht 65.0 in | Wt 190.0 lb

## 2020-03-11 DIAGNOSIS — Z01818 Encounter for other preprocedural examination: Secondary | ICD-10-CM | POA: Diagnosis not present

## 2020-03-11 DIAGNOSIS — R0602 Shortness of breath: Secondary | ICD-10-CM | POA: Diagnosis not present

## 2020-03-11 DIAGNOSIS — I10 Essential (primary) hypertension: Secondary | ICD-10-CM | POA: Diagnosis not present

## 2020-03-11 NOTE — Progress Notes (Signed)
Clinical Summary Susan Beck is a 86 y.o.female seen as a new patient, last seen in 05/2016 in our office  1. SOB -initial evaluation in 2016/2017 for reported SOB  - PFTs were normal.  -  12/2015 completed exercise nuclear stress modified Bruce protocol. She went 4 min and 2.3 METs, achieving her THR. No evidence of ischemia.  -07/2015  echo with LVEF 60-65%, no WMAs, grade I diastolic dysfunction.   - chronic SOB is unchanged. More generalized fatigue - does housework regularly without symptoms.  - some fatigue/DOE with walking up inclines. - no recent chest pains or SOB/DOE.   2. HTN - norvasc 10mg  caused dizziness and LE edema. Symptoms improved after decreasing norvasc to 5mg  daily. - compliant with meds  3. Breast cancer - being considred for left modified mastectomy   Past Medical History:  Diagnosis Date  . Bowel obstruction ([place])   . Chronic diastolic (congestive) heart failure ([place])   . Essential (primary) hypertension   . Gastro-esophageal reflux disease with esophagitis   . Nontoxic goiter, unspecified   . Other fatigue   . Shortness of breath      No Known Allergies   Current Outpatient Medications  Medication Sig Dispense Refill  . amLODipine (NORVASC) 5 MG tablet TAKE ONE TABLET BY MOUTH ONCE DAILY. 30 tablet 0  . omeprazole (PRILOSEC) 20 MG capsule Take 20 mg by mouth daily.    . Probiotic Product ([place]) CAPS Take by mouth.     No current facility-administered medications for this visit.     Past Surgical History:  Procedure Laterality Date  . APPENDECTOMY    . HERNIA REPAIR       No Known Allergies    No family history on file.   Social History Susan Beck reports that she quit smoking about 20 years ago. Her smoking use included cigarettes. She started smoking about 65 years ago. She smoked 1.00 pack per day. She has never used smokeless tobacco. Susan Beck has no history on file for alcohol  use.   Review of Systems CONSTITUTIONAL: No weight loss, fever, chills, weakness or fatigue.  HEENT: Eyes: No visual loss, blurred vision, double vision or yellow sclerae.No hearing loss, sneezing, congestion, runny nose or sore throat.  SKIN: No rash or itching.  CARDIOVASCULAR: per hpi RESPIRATORY: No shortness of breath, cough or sputum.  GASTROINTESTINAL: No anorexia, nausea, vomiting or diarrhea. No abdominal pain or blood.  GENITOURINARY: No burning on urination, no polyuria NEUROLOGICAL: No headache, dizziness, syncope, paralysis, ataxia, numbness or tingling in the extremities. No change in bowel or bladder control.  MUSCULOSKELETAL: No muscle, back pain, joint pain or stiffness.  LYMPHATICS: No enlarged nodes. No history of splenectomy.  PSYCHIATRIC: No history of depression or anxiety.  ENDOCRINOLOGIC: No reports of sweating, cold or heat intolerance. No polyuria or polydipsia.  Marland Kitchen   Physical Examination Today's Vitals   03/11/20 0806  BP: (!) 150/82  Pulse: 74  SpO2: 97%  Weight: 190 lb (86.2 kg)  Height: 5\' 5"  (1.651 m)   Body mass index is 31.62 kg/m.  Gen: resting comfortably, no acute distress HEENT: no scleral icterus, pupils equal round and reactive, no palptable cervical adenopathy,  CV: RRR, no m/r/g, no jvd Resp: Clear to auscultation bilaterally GI: abdomen is soft, non-tender, non-distended, normal bowel sounds, no hepatosplenomegaly MSK: extremities are warm, no edema.  Skin: warm, no rash Neuro:  no focal deficits Psych: appropriate affect   Diagnostic Studies  07/2015  echo Study Conclusions  - Left ventricle: The cavity size was normal. Wall thickness was increased in a pattern of mild LVH. Systolic function was normal. The estimated ejection fraction was in the range of 60% to 65%. Wall motion was normal; there were no regional wall motion abnormalities. Doppler parameters are consistent with abnormal left ventricular relaxation  (grade 1 diastolic dysfunction). Indeterminate filling pressures. - Aortic valve: Aortic valve sclerosis without stenosis. Mildly thickened, mildly calcified leaflets. - Left atrium: The atrium was mildly dilated. Volume/bsa, ES, (1-plane Simpson&'s, A2C): 30 ml/m^2.  10/2015 PFTs Normal   12/2015 MPI  Blood pressure demonstrated a hypertensive response to exercise.  There was no ST segment deviation noted during stress.  The study is normal. There are no perfusion defects.  This is a low risk study.  The left ventricular ejection fraction is hyperdynamic (>65%).   Assessment and Plan  1. SOB - overall generalized fatigue along with DOE with activities -chronic symptoms unchanged, extensive workup 2016/2017 that was benign - remains relatively active for her age, doing heavy housework. Will go for walks outside and tolerating - no repeat workup at this time  2. HTN - above goal - side effects on higher noravasc dose. Likely would start either ACE/ARB or diuretic, will request labs from pcp and decide  3. Preoperative evaluation - being considered for mastectomy - no acute active cardiac conditions - chronic SOB and fatigue with extensive workup in 2016/2017 that is unchanged. Remains relatively active given her age - would not recommend repeat cardiac testing at this time, recommend proceeding with surgery as planned  EKG today SR, no ischemic changes        Arnoldo Lenis, M.D.

## 2020-03-11 NOTE — Patient Instructions (Addendum)
Medication Instructions:   Your physician recommends that you continue on your current medications as directed. Please refer to the Current Medication list given to you today.   *If you need a refill on your cardiac medications before your next appointment, please call your pharmacy*   Lab Work: REQUESTING LABS FROM PCP   If you have labs (blood work) drawn today and your tests are completely normal, you will receive your results only by: Marland Kitchen MyChart Message (if you have MyChart) OR . A paper copy in the mail If you have any lab test that is abnormal or we need to change your treatment, we will call you to review the results.   Testing/Procedures: NONE ORDERED  TODAY   Follow-Up: At [facility], you and your health needs are our priority.  As part of our continuing mission to provide you with exceptional heart care, we have created designated Provider Care Teams.  These Care Teams include your primary Cardiologist (physician) and Advanced Practice Providers (APPs -  Physician Assistants and Nurse Practitioners) who all work together to provide you with the care you need, when you need it.  We recommend signing up for the patient portal called "MyChart".  Sign up information is provided on this After Visit Summary.  MyChart is used to connect with patients for Virtual Visits (Telemedicine).  Patients are able to view lab/test results, encounter notes, upcoming appointments, etc.  Non-urgent messages can be sent to your provider as well.   To learn more about what you can do with MyChart, go to [email or web address].    Your next appointment:   6 month(s)  The format for your next appointment:   In Person  Provider:   You may see Dr. Harl Bowie  or one of the following Advanced Practice Providers on your designated Care Team:    Bernerd Pho, PA-C   Ermalinda Barrios, PA-C     Other Instructions  Some one will be contacting you ( a social worker  Arcola Jansky ) with the  blood pressure program to further discuss steps

## 2020-03-14 ENCOUNTER — Other Ambulatory Visit: Payer: Self-pay

## 2020-03-14 ENCOUNTER — Ambulatory Visit (INDEPENDENT_AMBULATORY_CARE_PROVIDER_SITE_OTHER): Payer: Medicare PPO | Admitting: General Surgery

## 2020-03-14 ENCOUNTER — Encounter: Payer: Self-pay | Admitting: General Surgery

## 2020-03-14 VITALS — BP 182/81 | HR 80 | Temp 98.5°F | Resp 16 | Ht 65.0 in | Wt 190.0 lb

## 2020-03-14 DIAGNOSIS — C50412 Malignant neoplasm of upper-outer quadrant of left female breast: Secondary | ICD-10-CM | POA: Diagnosis not present

## 2020-03-14 NOTE — Patient Instructions (Signed)
Total or Modified Radical Mastectomy A total mastectomy and a modified radical mastectomy are surgeries that are done as part of treatment for breast cancer. You will have one of those types of surgery. Both types involve removing a breast.  In a total mastectomy (simple mastectomy), all breast tissue including the nipple will be removed.  In a modified radical mastectomy, lymph nodes under the arm will be removed along with the breast and nipple. Some of the lining over the muscle tissues under the breast may also be removed. These procedures may also be used to help prevent breast cancer. A preventive (prophylactic) mastectomy may be done if you are at an increased risk of breast cancer due to harmful changes (mutations) in certain genes (BRCA genes). In that case, the procedure involves removing both of your breasts. This can reduce your risk of developing breast cancer in the future. For a transgender person, a total mastectomy may be done as part of a surgical transition from female to female. Let your health care provider know about:  Any allergies you have.  All medicines you are taking, including vitamins, herbs, eye drops, creams, and over-the-counter medicines.  Any problems you or family members have had with anesthetic medicines.  Any blood disorders you have.  Any surgeries you have had.  Any medical conditions you have.  Whether you are pregnant or may be pregnant. What are the risks? Generally, this is a safe procedure. However, problems may occur, including:  Pain.  Infection.  Bleeding.  Allergic reactions to medicines.  Scar tissue.  Chest numbness on the side of the surgery.  Fluid buildup under the skin flaps where your breast was removed (seroma).  Sensation of throbbing or tingling.  Stress or sadness from losing your breast. If you have the lymph nodes under your arm removed, you may have arm swelling, weakness, or numbness on the same side of your body  as your surgery. What happens before the procedure?   Medicines  Ask your health care provider about: ? Changing or stopping your regular medicines. This is especially important if you are taking diabetes medicines or blood thinners. ? Taking medicines such as aspirin and ibuprofen. These medicines can thin your blood. Do not take these medicines unless your health care provider tells you to take them. ? Taking over-the-counter medicines, vitamins, herbs, and supplements.  Your health care team may give you antibiotic medicine to help prevent infection. General instructions  You may be checked for extra fluid around your lymph nodes (lymphedema).  Plan to have someone take you home from the hospital or clinic.  Plan to have a responsible adult care for you for at least 24 hours after you leave the hospital or clinic. This is important.  Ask your health care provider how your surgical site will be marked or identified.  You may be asked to shower with a germ-killing soap. What happens during the procedure?   To lower your risk of infection: ? Your health care team will wash or sanitize their hands. ? Your skin will be washed with soap.  An IV will be inserted into one of your veins.  You will be given a medicine to make you fall asleep (general anesthetic).  A wide incision will be made around your nipple. The skin and nipple inside the incision will be removed along with all breast tissue.  If you are having a modified radical mastectomy: ? The lining over your chest muscles will be removed. ? The  incision may be extended to reach the lymph nodes under your arm, or a second incision may be made. ? Lymph nodes will be removed.  Breast tissue and lymph nodes that are removed will be sent to the lab for testing.  You may have a drainage tube inserted into your incision to collect fluid that builds up after surgery. This tube will be connected to a suction bulb on the outside  of your body to remove the fluid.  Your incision or incisions will be closed with stitches (sutures).  A bandage (dressing) will be placed over your breast area. If lymph nodes were removed, a dressing will also be placed under your arm. The procedure may vary among health care providers and hospitals. What happens after the procedure?  Your blood pressure, heart rate, breathing rate, and blood oxygen level will be monitored until the medicines you were given have worn off.  You will be given pain medicine as needed.  You will be encouraged to get up and walk as soon as you can.  Your IV can be removed when you are able to eat and drink.  You may have a drainage tube in place for 2-3 days to prevent a collection of blood (hematoma) from developing in the breast area. You will be given instructions about caring for the drain before you go home.  A pressure bandage may be applied for 1-2 days to prevent bleeding or swelling. Ask your health care provider how to care for your pressure bandage at home. Summary  In a total mastectomy (simple mastectomy), all breast tissue including the nipple will be removed. In a modified radical mastectomy, the lymph nodes under the arm will be removed along with the breast and nipple.  Before the procedure, follow instructions from your health care provider about eating and drinking, and ask about changing or stopping your regular medicines.  You will be given a medicine to make you fall asleep (general anesthetic) during the procedure. This information is not intended to replace advice given to you by your health care provider. Make sure you discuss any questions you have with your health care provider. Document Revised: 10/21/2018 Document Reviewed: 05/21/2017 Elsevier Patient Education  2020 [employer].

## 2020-03-14 NOTE — Progress Notes (Signed)
Subjective:     Susan Beck  Patient here for scheduling of surgery.  She has been cleared by cardiology for surgical intervention. Objective:    BP (!) 182/81    Pulse 80    Temp 98.5 F (36.9 C) (Oral)    Resp 16    Ht 5\' 5"  (1.651 m)    Wt 190 lb (86.2 kg)    SpO2 94%    BMI 31.62 kg/m   General:  alert, cooperative and no distress       Assessment:    Left breast cancer    Plan:   Patient is scheduled for left modified radical mastectomy on 03/25/2020.  The risks and benefits of the procedure including bleeding, infection, cardiopulmonary difficulties, nerve pain, and left arm swelling were fully explained to the patient, who gave informed consent.

## 2020-03-15 NOTE — H&P (Signed)
Susan Beck; [id number]; April 18, 1932   HPI Patient is an 86 year old black female who was referred to my care by Dr. Legrand Rams in the breast center for newly diagnosed left lobular invasive carcinoma.  Patient noticed a lump in her left breast several months ago.  She underwent mammography and biopsy of 2 areas in the left breast.  Both were positive for lobular carcinoma.  A biopsy of the left axillary lymph node was negative.  Patient states she has a sister with breast cancer, diagnosed in her 53s.  She denies any left breast pain.  No nipple discharge has been noted. Patient reports some shortness of breath when walking a short distance from an office to the parking lot.  She denies any chest pain. Past Medical History:  Diagnosis Date  . Bowel obstruction ([place])   . Chronic diastolic (congestive) heart failure ([place])   . Essential (primary) hypertension   . Gastro-esophageal reflux disease with esophagitis   . Nontoxic goiter, unspecified   . Other fatigue   . Shortness of breath     Past Surgical History:  Procedure Laterality Date  . APPENDECTOMY    . HERNIA REPAIR      History reviewed. No pertinent family history.  Current Outpatient Medications on File Prior to Visit  Medication Sig Dispense Refill  . amLODipine (NORVASC) 5 MG tablet TAKE ONE TABLET BY MOUTH ONCE DAILY. 30 tablet 0  . omeprazole (PRILOSEC) 20 MG capsule Take 20 mg by mouth daily.    . Probiotic Product ([place]) CAPS Take by mouth.     No current facility-administered medications on file prior to visit.    No Known Allergies  Social History   Substance and Sexual Activity  Alcohol Use None    Social History   Tobacco Use  Smoking Status Former Smoker  . Packs/day: 1.00  . Types: Cigarettes  . Start date: 07/15/1954  . Quit date: 07/16/1999  . Years since quitting: 20.6  Smokeless Tobacco Never Used    Review of Systems  Constitutional: Negative.   HENT: Negative.   Eyes:  Negative.   Respiratory: Positive for shortness of breath.   Cardiovascular: Negative.   Gastrointestinal: Negative.   Genitourinary: Positive for frequency.  Musculoskeletal: Negative.   Skin: Negative.   Neurological: Negative.   Endo/Heme/Allergies: Negative.   Psychiatric/Behavioral: Negative.     Objective   Vitals:   02/29/20 1054  BP: (!) 178/101  Pulse: 85  Resp: 18  Temp: 97.6 F (36.4 C)  SpO2: 95%    Physical Exam Vitals reviewed.  Constitutional:      Appearance: Normal appearance. She is not ill-appearing.  HENT:     Head: Normocephalic and atraumatic.  Cardiovascular:     Rate and Rhythm: Normal rate and regular rhythm.     Heart sounds: Normal heart sounds. No murmur heard.  No friction rub. No gallop.   Pulmonary:     Effort: Pulmonary effort is normal. No respiratory distress.     Breath sounds: Normal breath sounds. No stridor. No wheezing, rhonchi or rales.  Lymphadenopathy:     Cervical: No cervical adenopathy.  Skin:    General: Skin is warm and dry.  Neurological:     Mental Status: She is alert and oriented to person, place, and time.   Breast: Left nipple retraction noted.  Dominant masses x2 in upper and lower outer quadrants of the left breast.  Some bruising is noted from the biopsies.  Masses measure approximately 3  to 4 cm in size, though it is difficult to fully assess the size.  Left axilla is negative for palpable nodes.  Right breast examination unremarkable.  Mammography and biopsy reports reviewed  Assessment  Invasive lobular carcinoma of left breast in multiple quadrants Shortness of breath Plan   Patient will need left modified radical mastectomy.  The risks and benefits of the procedure including bleeding, infection, cardiopulmonary difficulties, and the possibility of a blood transfusion were fully explained to the patient, who gave informed consent.  I will get cardiology clearance prior to surgery due to her shortness of  breath.

## 2020-03-21 NOTE — Patient Instructions (Signed)
Susan Beck  03/21/2020     @PREFPERIOPPHARMACY @   Your procedure is scheduled on  03/25/2020 .  Report to [facility] at  [phone]  A.M.  Call this number if you have problems the morning of surgery:  [phone]   Remember:  Do not eat or drink after midnight.                         Take these medicines the morning of surgery with A SIP OF WATER  None    Do not wear jewelry, make-up or nail polish.  Do not wear lotions, powders, or perfumes, or deodorant. Please brush your teeth.  Do not shave 48 hours prior to surgery.  Men may shave face and neck.  Do not bring valuables to the hospital.  [facility] is not responsible for any belongings or valuables.  Contacts, dentures or bridgework may not be worn into surgery.  Leave your suitcase in the car.  After surgery it may be brought to your room.  For patients admitted to the hospital, discharge time will be determined by your treatment team.  Patients discharged the day of surgery will not be allowed to drive home.   Name and phone number of your driver:   family Special instructions:  DO NOT smoke the morning of your procedure.  Please read over the following fact sheets that you were given. Anesthesia Post-op Instructions and Care and Recovery After Surgery       Total or Modified Radical Mastectomy, Care After This sheet gives you information about how to care for yourself after your procedure. Your health care provider may also give you more specific instructions. If you have problems or questions, contact your health care provider. What can I expect after the procedure? After the procedure, it is common to have:  Pain.  Numbness.  Stiffness in the arm or shoulder.  Feelings of stress, sadness, or depression. If the lymph nodes under your arm were removed, you may have arm swelling, weakness, or numbness on the same side of your body as your surgery. Follow these instructions at home: Incision  care   Follow instructions from your health care provider about how to take care of your incision. Make sure you: ? Wash your hands with soap and water before you change your bandage (dressing). If soap and water are not available, use hand sanitizer. ? Change your dressing as told by your health care provider. ? Leave stitches (sutures), skin glue, or adhesive strips in place. These skin closures may need to stay in place for 2 weeks or longer. If adhesive strip edges start to loosen and curl up, you may trim the loose edges. Do not remove adhesive strips completely unless your health care provider tells you to do that.  Check your incision area every day for signs of infection. Check for: ? Redness, swelling, or more pain. ? Fluid or blood. ? Warmth. ? Pus or a bad smell.  If you were sent home with a surgical drain in place, follow instructions from your health care provider about emptying it. Bathing  Do not take baths, swim, or use a hot tub until your health care provider approves. Ask your health care provider if you may take showers. You may only be allowed to take sponge baths. Activity  Return to your normal activities as told by your health care provider. Ask your health care provider what  activities are safe for you.  Avoid activities that take a lot of effort.  Be careful to avoid any activities that could cause an injury to your arm on the side of your surgery.  Do not lift anything that is heavier than 10 lb (4.5 kg), or the limit that you are told, until your health care provider says that it is safe.  Avoid lifting with the arm on the side of your surgery.  Do not carry heavy objects on your shoulder.  After your drain is removed, do exercises to prevent stiffness and swelling in your arm. Talk with your health care provider about which exercises are safe for you. General instructions  Take over-the-counter and prescription medicines only as told by your health  care provider.  You may eat what you usually do.  Keep your arm raised (elevated) above the level of your heart when you are sitting or lying down.  Do not wear tight jewelry on your arm, wrist, or fingers on the side of your surgery.  You may be given a tight sleeve (compression bandage) to wear over your arm on the side of your surgery. Wear this sleeve as told by your health care provider.  Ask your health care provider when you can start wearing a bra or using a breast prosthesis.  Before you are involved in certain procedures such as giving blood or having your blood pressure checked, tell all your health care providers if lymph nodes under your arm were removed. This is important information. Follow-up  Keep all follow-up visits as told by your health care provider. This is important.  Get checked for extra fluid around your lymph nodes (lymphedema) as often as told by your health care provider. Contact a health care provider if:  You have a fever.  Your pain medicine is not working.  Your arm swelling, weakness, or numbness has not improved after a few weeks.  You have new swelling in your breast area or arm.  You have redness, swelling, or more pain in your incision area.  You have fluid or blood coming from your incision.  Your incision feels warm to the touch.  You have pus or a bad smell coming from your incision. Get help right away if:  You have very bad pain in your breast area or arm.  You have chest pain.  You have difficulty breathing. Summary  Follow instructions from your health care provider about how to take care of your incision. Check your incision area every day for signs of infection.  Ask your health care provider what activities are safe for you.  Keep all follow-up visits as told by your health care provider. This is important.  Make sure you know which symptoms should cause you to contact your health care provider or to get help right  away. This information is not intended to replace advice given to you by your health care provider. Make sure you discuss any questions you have with your health care provider. Document Revised: 10/21/2018 Document Reviewed: 05/21/2017 Elsevier Patient Education  2020 [place] Anesthesia, Adult, Care After This sheet gives you information about how to care for yourself after your procedure. Your health care provider may also give you more specific instructions. If you have problems or questions, contact your health care provider. What can I expect after the procedure? After the procedure, the following side effects are common:  Pain or discomfort at the IV site.  Nausea.  Vomiting.  Sore throat.  Trouble concentrating.  Feeling cold or chills.  Weak or tired.  Sleepiness and fatigue.  Soreness and body aches. These side effects can affect parts of the body that were not involved in surgery. Follow these instructions at home:  For at least 24 hours after the procedure:  Have a responsible adult stay with you. It is important to have someone help care for you until you are awake and alert.  Rest as needed.  Do not: ? Participate in activities in which you could fall or become injured. ? Drive. ? Use heavy machinery. ? Drink alcohol. ? Take sleeping pills or medicines that cause drowsiness. ? Make important decisions or sign legal documents. ? Take care of children on your own. Eating and drinking  Follow any instructions from your health care provider about eating or drinking restrictions.  When you feel hungry, start by eating small amounts of foods that are soft and easy to digest (bland), such as toast. Gradually return to your regular diet.  Drink enough fluid to keep your urine pale yellow.  If you vomit, rehydrate by drinking water, juice, or clear broth. General instructions  If you have sleep apnea, surgery and certain medicines can increase  your risk for breathing problems. Follow instructions from your health care provider about wearing your sleep device: ? Anytime you are sleeping, including during daytime naps. ? While taking prescription pain medicines, sleeping medicines, or medicines that make you drowsy.  Return to your normal activities as told by your health care provider. Ask your health care provider what activities are safe for you.  Take over-the-counter and prescription medicines only as told by your health care provider.  If you smoke, do not smoke without supervision.  Keep all follow-up visits as told by your health care provider. This is important. Contact a health care provider if:  You have nausea or vomiting that does not get better with medicine.  You cannot eat or drink without vomiting.  You have pain that does not get better with medicine.  You are unable to pass urine.  You develop a skin rash.  You have a fever.  You have redness around your IV site that gets worse. Get help right away if:  You have difficulty breathing.  You have chest pain.  You have blood in your urine or stool, or you vomit blood. Summary  After the procedure, it is common to have a sore throat or nausea. It is also common to feel tired.  Have a responsible adult stay with you for the first 24 hours after general anesthesia. It is important to have someone help care for you until you are awake and alert.  When you feel hungry, start by eating small amounts of foods that are soft and easy to digest (bland), such as toast. Gradually return to your regular diet.  Drink enough fluid to keep your urine pale yellow.  Return to your normal activities as told by your health care provider. Ask your health care provider what activities are safe for you. This information is not intended to replace advice given to you by your health care provider. Make sure you discuss any questions you have with your health care  provider. Document Revised: 08/20/2017 Document Reviewed: 04/02/2017 Elsevier Patient Education  [place]. How to Use Chlorhexidine for Bathing Chlorhexidine gluconate (CHG) is a germ-killing (antiseptic) solution that is used to clean the skin. It can get rid of the bacteria that normally live  on the skin and can keep them away for about 24 hours. To clean your skin with CHG, you may be given:  A CHG solution to use in the shower or as part of a sponge bath.  A prepackaged cloth that contains CHG. Cleaning your skin with CHG may help lower the risk for infection:  While you are staying in the intensive care unit of the hospital.  If you have a vascular access, such as a central line, to provide short-term or long-term access to your veins.  If you have a catheter to drain urine from your bladder.  If you are on a ventilator. A ventilator is a machine that helps you breathe by moving air in and out of your lungs.  After surgery. What are the risks? Risks of using CHG include:  A skin reaction.  Hearing loss, if CHG gets in your ears.  Eye injury, if CHG gets in your eyes and is not rinsed out.  The CHG product catching fire. Make sure that you avoid smoking and flames after applying CHG to your skin. Do not use CHG:  If you have a chlorhexidine allergy or have previously reacted to chlorhexidine.  On babies younger than 67 months of age. How to use CHG solution  Use CHG only as told by your health care provider, and follow the instructions on the label.  Use the full amount of CHG as directed. Usually, this is one bottle. During a shower Follow these steps when using CHG solution during a shower (unless your health care provider gives you different instructions): 1. Start the shower. 2. Use your normal soap and shampoo to wash your face and hair. 3. Turn off the shower or move out of the shower stream. 4. Pour the CHG onto a clean washcloth. Do not use any type  of brush or rough-edged sponge. 5. Starting at your neck, lather your body down to your toes. Make sure you follow these instructions: ? If you will be having surgery, pay special attention to the part of your body where you will be having surgery. Scrub this area for at least 1 minute. ? Do not use CHG on your head or face. If the solution gets into your ears or eyes, rinse them well with water. ? Avoid your genital area. ? Avoid any areas of skin that have broken skin, cuts, or scrapes. ? Scrub your back and under your arms. Make sure to wash skin folds. 6. Let the lather sit on your skin for 1-2 minutes or as long as told by your health care provider. 7. Thoroughly rinse your entire body in the shower. Make sure that all body creases and crevices are rinsed well. 8. Dry off with a clean towel. Do not put any substances on your body afterward--such as powder, lotion, or perfume--unless you are told to do so by your health care provider. Only use lotions that are recommended by the manufacturer. 9. Put on clean clothes or pajamas. 10. If it is the night before your surgery, sleep in clean sheets.  During a sponge bath Follow these steps when using CHG solution during a sponge bath (unless your health care provider gives you different instructions): 1. Use your normal soap and shampoo to wash your face and hair. 2. Pour the CHG onto a clean washcloth. 3. Starting at your neck, lather your body down to your toes. Make sure you follow these instructions: ? If you will be having surgery, pay special attention  to the part of your body where you will be having surgery. Scrub this area for at least 1 minute. ? Do not use CHG on your head or face. If the solution gets into your ears or eyes, rinse them well with water. ? Avoid your genital area. ? Avoid any areas of skin that have broken skin, cuts, or scrapes. ? Scrub your back and under your arms. Make sure to wash skin folds. 4. Let the lather sit  on your skin for 1-2 minutes or as long as told by your health care provider. 5. Using a different clean, wet washcloth, thoroughly rinse your entire body. Make sure that all body creases and crevices are rinsed well. 6. Dry off with a clean towel. Do not put any substances on your body afterward--such as powder, lotion, or perfume--unless you are told to do so by your health care provider. Only use lotions that are recommended by the manufacturer. 7. Put on clean clothes or pajamas. 8. If it is the night before your surgery, sleep in clean sheets. How to use CHG prepackaged cloths  Only use CHG cloths as told by your health care provider, and follow the instructions on the label.  Use the CHG cloth on clean, dry skin.  Do not use the CHG cloth on your head or face unless your health care provider tells you to.  When washing with the CHG cloth: ? Avoid your genital area. ? Avoid any areas of skin that have broken skin, cuts, or scrapes. Before surgery Follow these steps when using a CHG cloth to clean before surgery (unless your health care provider gives you different instructions): 1. Using the CHG cloth, vigorously scrub the part of your body where you will be having surgery. Scrub using a back-and-forth motion for 3 minutes. The area on your body should be completely wet with CHG when you are done scrubbing. 2. Do not rinse. Discard the cloth and let the area air-dry. Do not put any substances on the area afterward, such as powder, lotion, or perfume. 3. Put on clean clothes or pajamas. 4. If it is the night before your surgery, sleep in clean sheets.  For general bathing Follow these steps when using CHG cloths for general bathing (unless your health care provider gives you different instructions). 1. Use a separate CHG cloth for each area of your body. Make sure you wash between any folds of skin and between your fingers and toes. Wash your body in the following order, switching to a  new cloth after each step: ? The front of your neck, shoulders, and chest. ? Both of your arms, under your arms, and your hands. ? Your stomach and groin area, avoiding the genitals. ? Your right leg and foot. ? Your left leg and foot. ? The back of your neck, your back, and your buttocks. 2. Do not rinse. Discard the cloth and let the area air-dry. Do not put any substances on your body afterward--such as powder, lotion, or perfume--unless you are told to do so by your health care provider. Only use lotions that are recommended by the manufacturer. 3. Put on clean clothes or pajamas. Contact a health care provider if:  Your skin gets irritated after scrubbing.  You have questions about using your solution or cloth. Get help right away if:  Your eyes become very red or swollen.  Your eyes itch badly.  Your skin itches badly and is red or swollen.  Your hearing changes.  You have trouble seeing.  You have swelling or tingling in your mouth or throat.  You have trouble breathing.  You swallow any chlorhexidine. Summary  Chlorhexidine gluconate (CHG) is a germ-killing (antiseptic) solution that is used to clean the skin. Cleaning your skin with CHG may help to lower your risk for infection.  You may be given CHG to use for bathing. It may be in a bottle or in a prepackaged cloth to use on your skin. Carefully follow your health care provider's instructions and the instructions on the product label.  Do not use CHG if you have a chlorhexidine allergy.  Contact your health care provider if your skin gets irritated after scrubbing. This information is not intended to replace advice given to you by your health care provider. Make sure you discuss any questions you have with your health care provider. Document Revised: 11/03/2018 Document Reviewed: 07/15/2017 Elsevier Patient Education  [place].

## 2020-03-22 ENCOUNTER — Encounter (HOSPITAL_COMMUNITY): Payer: Self-pay

## 2020-03-22 ENCOUNTER — Encounter (HOSPITAL_COMMUNITY)
Admission: RE | Admit: 2020-03-22 | Discharge: 2020-03-22 | Disposition: A | Discharge status: Home / Self Care | Payer: Medicare PPO | Source: Ambulatory Visit | Attending: General Surgery | Admitting: General Surgery

## 2020-03-22 ENCOUNTER — Other Ambulatory Visit: Payer: Self-pay

## 2020-03-22 ENCOUNTER — Other Ambulatory Visit (HOSPITAL_COMMUNITY)
Admission: RE | Admit: 2020-03-22 | Discharge: 2020-03-22 | Disposition: A | Discharge status: Home / Self Care | Payer: Medicare PPO | Source: Ambulatory Visit | Attending: General Surgery | Admitting: General Surgery

## 2020-03-22 DIAGNOSIS — Z20822 Contact with and (suspected) exposure to covid-19: Secondary | ICD-10-CM | POA: Insufficient documentation

## 2020-03-22 DIAGNOSIS — Z01812 Encounter for preprocedural laboratory examination: Secondary | ICD-10-CM | POA: Diagnosis not present

## 2020-03-22 DIAGNOSIS — C50912 Malignant neoplasm of unspecified site of left female breast: Secondary | ICD-10-CM | POA: Diagnosis not present

## 2020-03-22 LAB — CBC WITH DIFFERENTIAL/PLATELET
Abs Immature Granulocytes: 0.02 10*3/uL (ref 0.00–0.07)
Basophils Absolute: 0.1 10*3/uL (ref 0.0–0.1)
Basophils Relative: 1 %
Eosinophils Absolute: 0 10*3/uL (ref 0.0–0.5)
Eosinophils Relative: 0 %
HCT: 44.1 % (ref 36.0–46.0)
Hemoglobin: 14.3 g/dL (ref 12.0–15.0)
Immature Granulocytes: 0 %
Lymphocytes Relative: 29 %
Lymphs Abs: 2.3 10*3/uL (ref 0.7–4.0)
MCH: 29.5 pg (ref 26.0–34.0)
MCHC: 32.4 g/dL (ref 30.0–36.0)
MCV: 91.1 fL (ref 80.0–100.0)
Monocytes Absolute: 0.7 10*3/uL (ref 0.1–1.0)
Monocytes Relative: 9 %
Neutro Abs: 4.8 10*3/uL (ref 1.7–7.7)
Neutrophils Relative %: 61 %
Platelets: 291 10*3/uL (ref 150–400)
RBC: 4.84 MIL/uL (ref 3.87–5.11)
RDW: 14.6 % (ref 11.5–15.5)
WBC: 8 10*3/uL (ref 4.0–10.5)
nRBC: 0 % (ref 0.0–0.2)

## 2020-03-22 LAB — COMPREHENSIVE METABOLIC PANEL
ALT: 24 U/L (ref 0–44)
AST: 21 U/L (ref 15–41)
Albumin: 4 g/dL (ref 3.5–5.0)
Alkaline Phosphatase: 52 U/L (ref 38–126)
Anion gap: 8 (ref 5–15)
BUN: 11 mg/dL (ref 8–23)
CO2: 25 mmol/L (ref 22–32)
Calcium: 9.2 mg/dL (ref 8.9–10.3)
Chloride: 105 mmol/L (ref 98–111)
Creatinine, Ser: 0.87 mg/dL (ref 0.44–1.00)
GFR calc Af Amer: >60 mL/min (ref >60)
GFR calc non Af Amer: >60 mL/min (ref >60)
Glucose, Bld: 102 mg/dL — ABNORMAL HIGH (ref 70–99)
Potassium: 3.8 mmol/L (ref 3.5–5.1)
Sodium: 138 mmol/L (ref 135–145)
Total Bilirubin: 0.6 mg/dL (ref 0.3–1.2)
Total Protein: 7.7 g/dL (ref 6.5–8.1)

## 2020-03-22 LAB — TYPE AND SCREEN
ABO/RH(D): B POS
Antibody Screen: NEGATIVE

## 2020-03-23 LAB — SARS CORONAVIRUS 2 (TAT 6-24 HRS): SARS Coronavirus 2: NEGATIVE

## 2020-03-25 ENCOUNTER — Encounter (HOSPITAL_COMMUNITY)
Admission: RE | Disposition: A | Discharge status: Home / Self Care | Payer: Self-pay | Source: Home / Self Care | Attending: General Surgery

## 2020-03-25 ENCOUNTER — Ambulatory Visit (HOSPITAL_COMMUNITY): Payer: Medicare PPO | Admitting: Anesthesiology

## 2020-03-25 ENCOUNTER — Encounter (HOSPITAL_COMMUNITY): Payer: Self-pay | Admitting: General Surgery

## 2020-03-25 ENCOUNTER — Observation Stay (HOSPITAL_COMMUNITY)
Admission: RE | Admit: 2020-03-25 | Discharge: 2020-03-26 | Disposition: A | Discharge status: Home / Self Care | Payer: Medicare PPO | Attending: General Surgery | Admitting: General Surgery

## 2020-03-25 ENCOUNTER — Other Ambulatory Visit: Payer: Self-pay

## 2020-03-25 DIAGNOSIS — Z87891 Personal history of nicotine dependence: Secondary | ICD-10-CM | POA: Diagnosis not present

## 2020-03-25 DIAGNOSIS — R0602 Shortness of breath: Secondary | ICD-10-CM | POA: Diagnosis not present

## 2020-03-25 DIAGNOSIS — C50412 Malignant neoplasm of upper-outer quadrant of left female breast: Principal | ICD-10-CM

## 2020-03-25 DIAGNOSIS — I5032 Chronic diastolic (congestive) heart failure: Secondary | ICD-10-CM | POA: Diagnosis not present

## 2020-03-25 DIAGNOSIS — I110 Hypertensive heart disease with heart failure: Secondary | ICD-10-CM | POA: Insufficient documentation

## 2020-03-25 DIAGNOSIS — C50912 Malignant neoplasm of unspecified site of left female breast: Secondary | ICD-10-CM | POA: Diagnosis not present

## 2020-03-25 DIAGNOSIS — I509 Heart failure, unspecified: Secondary | ICD-10-CM | POA: Diagnosis not present

## 2020-03-25 DIAGNOSIS — R2689 Other abnormalities of gait and mobility: Secondary | ICD-10-CM | POA: Insufficient documentation

## 2020-03-25 DIAGNOSIS — R350 Frequency of micturition: Secondary | ICD-10-CM | POA: Diagnosis not present

## 2020-03-25 DIAGNOSIS — C773 Secondary and unspecified malignant neoplasm of axilla and upper limb lymph nodes: Secondary | ICD-10-CM | POA: Diagnosis not present

## 2020-03-25 DIAGNOSIS — Z9012 Acquired absence of left breast and nipple: Secondary | ICD-10-CM

## 2020-03-25 DIAGNOSIS — K219 Gastro-esophageal reflux disease without esophagitis: Secondary | ICD-10-CM | POA: Diagnosis not present

## 2020-03-25 HISTORY — PX: MASTECTOMY MODIFIED RADICAL: SHX5962

## 2020-03-25 HISTORY — PX: MASTECTOMY: SHX3

## 2020-03-25 LAB — ABO/RH: ABO/RH(D): B POS

## 2020-03-25 SURGERY — MASTECTOMY, MODIFIED RADICAL
Anesthesia: General | Site: Breast | Laterality: Left

## 2020-03-25 MED ORDER — DIPHENHYDRAMINE HCL 50 MG/ML IJ SOLN: 12.5000 mg | Freq: Four times a day (QID) | INTRAMUSCULAR | Status: DC | PRN

## 2020-03-25 MED ORDER — LIDOCAINE HCL (CARDIAC) PF 100 MG/5ML IV SOSY
PREFILLED_SYRINGE | INTRAVENOUS | Status: DC | PRN
  Administered 2020-03-25: 100 mg via INTRAVENOUS

## 2020-03-25 MED ORDER — FENTANYL CITRATE (PF) 100 MCG/2ML IJ SOLN
INTRAMUSCULAR | Status: DC | PRN
  Administered 2020-03-25 (×2): 25 ug via INTRAVENOUS
  Administered 2020-03-25: 50 ug via INTRAVENOUS
  Administered 2020-03-25 (×4): 25 ug via INTRAVENOUS

## 2020-03-25 MED ORDER — ONDANSETRON HCL 4 MG/2ML IJ SOLN
INTRAMUSCULAR | Status: AC
  Filled 2020-03-25: qty 2

## 2020-03-25 MED ORDER — FENTANYL CITRATE (PF) 100 MCG/2ML IJ SOLN
INTRAMUSCULAR | Status: AC
  Filled 2020-03-25: qty 2

## 2020-03-25 MED ORDER — KETOROLAC TROMETHAMINE 15 MG/ML IJ SOLN
15.0000 mg | Freq: Once | INTRAMUSCULAR | Status: AC
  Administered 2020-03-25: 15 mg via INTRAVENOUS

## 2020-03-25 MED ORDER — SODIUM CHLORIDE 0.9 % IV SOLN: INTRAVENOUS | Status: DC

## 2020-03-25 MED ORDER — ENOXAPARIN SODIUM 40 MG/0.4ML ~~LOC~~ SOLN: 40.0000 mg | SUBCUTANEOUS | Status: DC

## 2020-03-25 MED ORDER — CHLORHEXIDINE GLUCONATE CLOTH 2 % EX PADS
6.0000 | MEDICATED_PAD | Freq: Once | CUTANEOUS | Status: DC
  Administered 2020-03-25: 6 via TOPICAL

## 2020-03-25 MED ORDER — ONDANSETRON HCL 4 MG/2ML IJ SOLN: 4.0000 mg | Freq: Once | INTRAMUSCULAR | Status: DC | PRN

## 2020-03-25 MED ORDER — ONDANSETRON HCL 4 MG/2ML IJ SOLN
INTRAMUSCULAR | Status: DC | PRN
  Administered 2020-03-25 (×2): 4 mg via INTRAVENOUS

## 2020-03-25 MED ORDER — POVIDONE-IODINE 10 % EX OINT
TOPICAL_OINTMENT | CUTANEOUS | Status: AC
  Filled 2020-03-25: qty 1

## 2020-03-25 MED ORDER — BUPIVACAINE LIPOSOME 1.3 % IJ SUSP
INTRAMUSCULAR | Status: DC | PRN
  Administered 2020-03-25: 20 mL

## 2020-03-25 MED ORDER — ACETAMINOPHEN 650 MG RE SUPP: 650.0000 mg | Freq: Four times a day (QID) | RECTAL | Status: DC | PRN

## 2020-03-25 MED ORDER — SIMETHICONE 80 MG PO CHEW: 40.0000 mg | CHEWABLE_TABLET | Freq: Four times a day (QID) | ORAL | Status: DC | PRN

## 2020-03-25 MED ORDER — LIDOCAINE 2% (20 MG/ML) 5 ML SYRINGE
INTRAMUSCULAR | Status: AC
  Filled 2020-03-25: qty 5

## 2020-03-25 MED ORDER — GLYCOPYRROLATE PF 0.2 MG/ML IJ SOSY
PREFILLED_SYRINGE | INTRAMUSCULAR | Status: AC
  Filled 2020-03-25: qty 1

## 2020-03-25 MED ORDER — PROPOFOL 10 MG/ML IV BOLUS
INTRAVENOUS | Status: DC | PRN
  Administered 2020-03-25: 200 mg via INTRAVENOUS

## 2020-03-25 MED ORDER — TRAMADOL HCL 50 MG PO TABS
50.0000 mg | ORAL_TABLET | Freq: Four times a day (QID) | ORAL | Status: DC | PRN
  Administered 2020-03-25: 50 mg via ORAL
  Filled 2020-03-25: qty 1

## 2020-03-25 MED ORDER — ADULT MULTIVITAMIN W/MINERALS CH
1.0000 | ORAL_TABLET | Freq: Every day | ORAL | Status: DC
  Administered 2020-03-25 – 2020-03-26 (×2): 1 via ORAL
  Filled 2020-03-25 (×2): qty 1

## 2020-03-25 MED ORDER — ACETAMINOPHEN 325 MG PO TABS
650.0000 mg | ORAL_TABLET | Freq: Four times a day (QID) | ORAL | Status: DC | PRN
  Administered 2020-03-25: 650 mg via ORAL
  Filled 2020-03-25: qty 2

## 2020-03-25 MED ORDER — HYDROMORPHONE HCL 1 MG/ML IJ SOLN
0.2500 mg | INTRAMUSCULAR | Status: DC | PRN
  Filled 2020-03-25: qty 0.5

## 2020-03-25 MED ORDER — DEXAMETHASONE SODIUM PHOSPHATE 4 MG/ML IJ SOLN
INTRAMUSCULAR | Status: DC | PRN
  Administered 2020-03-25: 4 mg via INTRAVENOUS

## 2020-03-25 MED ORDER — CEFAZOLIN SODIUM-DEXTROSE 2-4 GM/100ML-% IV SOLN
INTRAVENOUS | Status: AC
  Filled 2020-03-25: qty 100

## 2020-03-25 MED ORDER — ASCORBIC ACID 500 MG PO TABS
1000.0000 mg | ORAL_TABLET | Freq: Every day | ORAL | Status: DC
  Administered 2020-03-25 – 2020-03-26 (×2): 1000 mg via ORAL
  Filled 2020-03-25 (×2): qty 2

## 2020-03-25 MED ORDER — ONDANSETRON 4 MG PO TBDP: 4.0000 mg | ORAL_TABLET | Freq: Four times a day (QID) | ORAL | Status: DC | PRN

## 2020-03-25 MED ORDER — ENOXAPARIN SODIUM 40 MG/0.4ML ~~LOC~~ SOLN
40.0000 mg | Freq: Once | SUBCUTANEOUS | Status: AC
  Administered 2020-03-25: 40 mg via SUBCUTANEOUS

## 2020-03-25 MED ORDER — CHLORHEXIDINE GLUCONATE 0.12 % MT SOLN
15.0000 mL | Freq: Once | OROMUCOSAL | Status: AC
  Administered 2020-03-25: 15 mL via OROMUCOSAL

## 2020-03-25 MED ORDER — LACTATED RINGERS IV SOLN: INTRAVENOUS | Status: DC | PRN

## 2020-03-25 MED ORDER — KETOROLAC TROMETHAMINE 15 MG/ML IJ SOLN
INTRAMUSCULAR | Status: AC
  Filled 2020-03-25: qty 1

## 2020-03-25 MED ORDER — BUPIVACAINE LIPOSOME 1.3 % IJ SUSP
INTRAMUSCULAR | Status: AC
  Filled 2020-03-25: qty 20

## 2020-03-25 MED ORDER — PANTOPRAZOLE SODIUM 40 MG PO TBEC
40.0000 mg | DELAYED_RELEASE_TABLET | Freq: Every day | ORAL | Status: DC
  Administered 2020-03-25 – 2020-03-26 (×2): 40 mg via ORAL
  Filled 2020-03-25 (×2): qty 1

## 2020-03-25 MED ORDER — ONDANSETRON HCL 4 MG/2ML IJ SOLN: 4.0000 mg | Freq: Four times a day (QID) | INTRAMUSCULAR | Status: DC | PRN

## 2020-03-25 MED ORDER — CEFAZOLIN SODIUM-DEXTROSE 2-4 GM/100ML-% IV SOLN
2.0000 g | INTRAVENOUS | Status: AC
  Administered 2020-03-25: 2 g via INTRAVENOUS

## 2020-03-25 MED ORDER — DIPHENHYDRAMINE HCL 12.5 MG/5ML PO ELIX: 12.5000 mg | ORAL_SOLUTION | Freq: Four times a day (QID) | ORAL | Status: DC | PRN

## 2020-03-25 MED ORDER — 0.9 % SODIUM CHLORIDE (POUR BTL) OPTIME
TOPICAL | Status: DC | PRN
  Administered 2020-03-25: 500 mL

## 2020-03-25 MED ORDER — MORPHINE SULFATE (PF) 2 MG/ML IV SOLN: 2.0000 mg | INTRAVENOUS | Status: DC | PRN

## 2020-03-25 MED ORDER — VITAMIN D 25 MCG (1000 UNIT) PO TABS
2000.0000 [IU] | ORAL_TABLET | Freq: Every day | ORAL | Status: DC
  Administered 2020-03-25 – 2020-03-26 (×2): 2000 [IU] via ORAL
  Filled 2020-03-25 (×2): qty 2

## 2020-03-25 MED ORDER — AMLODIPINE BESYLATE 5 MG PO TABS
5.0000 mg | ORAL_TABLET | Freq: Every day | ORAL | Status: DC
  Administered 2020-03-25: 5 mg via ORAL
  Filled 2020-03-25: qty 1

## 2020-03-25 MED ORDER — CHLORHEXIDINE GLUCONATE 0.12 % MT SOLN
OROMUCOSAL | Status: AC
  Filled 2020-03-25: qty 15

## 2020-03-25 MED ORDER — LABETALOL HCL 5 MG/ML IV SOLN
INTRAVENOUS | Status: AC
  Filled 2020-03-25: qty 4

## 2020-03-25 MED ORDER — PROPOFOL 10 MG/ML IV BOLUS
INTRAVENOUS | Status: AC
  Filled 2020-03-25: qty 40

## 2020-03-25 MED ORDER — ORAL CARE MOUTH RINSE: 15.0000 mL | Freq: Once | OROMUCOSAL | Status: AC

## 2020-03-25 MED ORDER — LACTATED RINGERS IV SOLN: Freq: Once | INTRAVENOUS | Status: AC

## 2020-03-25 MED ORDER — LABETALOL HCL 5 MG/ML IV SOLN
5.0000 mg | INTRAVENOUS | Status: DC | PRN
  Administered 2020-03-25: 5 mg via INTRAVENOUS

## 2020-03-25 MED ORDER — ENOXAPARIN SODIUM 40 MG/0.4ML ~~LOC~~ SOLN
SUBCUTANEOUS | Status: AC
  Filled 2020-03-25: qty 0.4

## 2020-03-25 MED ORDER — DEXAMETHASONE SODIUM PHOSPHATE 10 MG/ML IJ SOLN
INTRAMUSCULAR | Status: AC
  Filled 2020-03-25: qty 1

## 2020-03-25 MED ORDER — POVIDONE-IODINE 10 % OINT PACKET
TOPICAL_OINTMENT | CUTANEOUS | Status: DC | PRN
  Administered 2020-03-25: 2 via TOPICAL

## 2020-03-25 SURGICAL SUPPLY — 47 items
APL PRP STRL LF DISP 70% ISPRP (MISCELLANEOUS) ×1
APPLIER CLIP 11 MED OPEN (CLIP)
APPLIER CLIP 9.375 SM OPEN (CLIP) ×3
APR CLP MED 11 20 MLT OPN (CLIP)
APR CLP SM 9.3 20 MLT OPN (CLIP) ×1
BINDER BREAST XLRG (GAUZE/BANDAGES/DRESSINGS) ×6 IMPLANT
CHLORAPREP W/TINT 26 (MISCELLANEOUS) ×3 IMPLANT
CLIP APPLIE 11 MED OPEN (CLIP) IMPLANT
CLIP APPLIE 9.375 SM OPEN (CLIP) ×1 IMPLANT
CLOTH BEACON ORANGE TIMEOUT ST (SAFETY) ×3 IMPLANT
COVER LIGHT HANDLE STERIS (MISCELLANEOUS) ×6 IMPLANT
COVER WAND RF STERILE (DRAPES) ×3 IMPLANT
DRAPE HALF SHEET 40X57 (DRAPES) ×6 IMPLANT
ELECT REM PT RETURN 9FT ADLT (ELECTROSURGICAL) ×3
ELECTRODE REM PT RTRN 9FT ADLT (ELECTROSURGICAL) ×1 IMPLANT
EVACUATOR DRAINAGE 10X20 100CC (DRAIN) ×1 IMPLANT
EVACUATOR SILICONE 100CC (DRAIN) ×3
GAUZE SPONGE 4X4 12PLY STRL (GAUZE/BANDAGES/DRESSINGS) ×3 IMPLANT
GLOVE BIOGEL PI IND STRL 7.0 (GLOVE) ×3 IMPLANT
GLOVE BIOGEL PI INDICATOR 7.0 (GLOVE) ×6
GLOVE SURG SS PI 7.5 STRL IVOR (GLOVE) ×3 IMPLANT
GOWN STRL REUS W/TWL LRG LVL3 (GOWN DISPOSABLE) ×9 IMPLANT
INST SET MINOR GENERAL (KITS) ×3 IMPLANT
KIT TURNOVER KIT A (KITS) ×3 IMPLANT
MANIFOLD NEPTUNE II (INSTRUMENTS) ×3 IMPLANT
NEEDLE HYPO 18GX1.5 BLUNT FILL (NEEDLE) ×3 IMPLANT
NEEDLE HYPO 22GX1.5 SAFETY (NEEDLE) ×3 IMPLANT
NS IRRIG 1000ML POUR BTL (IV SOLUTION) ×3 IMPLANT
PACK MINOR (CUSTOM PROCEDURE TRAY) ×3 IMPLANT
PAD ABD 8X10 STRL (GAUZE/BANDAGES/DRESSINGS) ×6 IMPLANT
PAD ARMBOARD 7.5X6 YLW CONV (MISCELLANEOUS) ×3 IMPLANT
SET BASIN LINEN APH (SET/KITS/TRAYS/PACK) ×3 IMPLANT
SPONGE DRAIN TRACH 4X4 STRL 2S (GAUZE/BANDAGES/DRESSINGS) ×3 IMPLANT
SPONGE INTESTINAL PEANUT (DISPOSABLE) IMPLANT
SPONGE LAP 18X18 RF (DISPOSABLE) ×6 IMPLANT
STAPLER VISISTAT (STAPLE) ×3 IMPLANT
SUT ETHILON 3 0 FSL (SUTURE) ×3 IMPLANT
SUT SILK 2 0 (SUTURE)
SUT SILK 2 0 SH (SUTURE) ×3 IMPLANT
SUT SILK 2-0 18XBRD TIE 12 (SUTURE) IMPLANT
SUT VIC AB 2-0 CT1 27 (SUTURE) ×15
SUT VIC AB 2-0 CT1 TAPERPNT 27 (SUTURE) ×5 IMPLANT
SUT VIC AB 3-0 SH 27 (SUTURE) ×3
SUT VIC AB 3-0 SH 27X BRD (SUTURE) ×1 IMPLANT
SUT VICRYL AB 2 0 TIES (SUTURE) ×3 IMPLANT
SYR 20ML LL LF (SYRINGE) ×6 IMPLANT
SYR BULB IRRIG 60ML STRL (SYRINGE) ×3 IMPLANT

## 2020-03-25 NOTE — Op Note (Signed)
Patient:  Susan Beck  DOB:  [birth year]  MRN:  [id number]   Preop Diagnosis: Left breast carcinoma  Postop Diagnosis: Same  Procedure: Left modified radical mastectomy  Surgeon: Aviva Signs, MD  Anes: General endotracheal  Indications: Patient is an 86 year old black female who presents with 2 areas of lobular carcinoma in the left breast.  She now presents for left modified radical mastectomy.  The risks and benefits of the procedure including bleeding, infection, cardiopulmonary difficulties, left arm swelling and pain, and the possibility of a blood transfusion were fully explained to the patient, who gave informed consent.  Procedure note: The patient was placed in the supine position.  After induction of general endotracheal anesthesia, the left breast and axilla were prepped and draped using the usual sterile technique with ChloraPrep.  Surgical site confirmation was performed.  An elliptical incision was made medial to lateral around the left nipple.  A superior flap was then formed to the clavicle.  Care was taken to take some extra normal tissue anteriorly in the upper, outer quadrant of the left breast given the mass that was present.  An inferior flap was then formed to the chest wall.  The left breast was then removed medial to lateral off the pectoralis major muscle using Bovie electrocautery.  A suture was placed superiorly for orientation purposes.  The specimen was sent to pathology for further examination.  In addition, additional breast tissue was taken from the medial inferior flap.  In the left axilla, 2 areas of matted lymph nodes were noted.  These were removed.  A bleeding was controlled using clips.  A level 2 axillary dissection was performed.  The specimen was sent to pathology for further examination.  A #10 flat Jackson-Pratt drain was placed along the left breast flap and axilla and brought through separate stab wound inferior to the incision line.  He was secured  into place using a 3-0 nylon suture.  Wound was irrigated with normal saline.  The subcutaneous layer was reapproximated using 2-0 Vicryl interrupted sutures.  Exparel was instilled into the surrounding wound.  The skin was closed using staples.  Betadine ointment and a dry sterile dressing were applied.  All tape and needle counts were correct at the end of the procedure.  Patient was extubated in the operating room and transferred to PACU in stable condition.  Complications: None  EBL: 25 cc  Specimen: Left breast, left axillary contents  Drains: Jackson-Pratt drain to left mastectomy flap and axilla

## 2020-03-25 NOTE — Transfer of Care (Signed)
Immediate Anesthesia Transfer of Care Note  Patient: LYNDEE HERBST  Procedure(s) Performed: MASTECTOMY MODIFIED RADICAL (Left Breast)  Patient Location: PACU  Anesthesia Type:General  Level of Consciousness: awake, alert , oriented and patient cooperative  Airway & Oxygen Therapy: Patient Spontanous Breathing and Patient connected to nasal cannula oxygen  Post-op Assessment: Report given to RN, Post -op Vital signs reviewed and stable and Patient moving all extremities  Post vital signs: Reviewed and stable  Last Vitals:  Vitals Value Taken Time  BP    Temp    Pulse 84 03/25/20 0904  Resp 16 03/25/20 0904  SpO2 100 % 03/25/20 0904  Vitals shown include unvalidated device data.  Last Pain:  Vitals:   03/25/20 0715  TempSrc: Oral      Patients Stated Pain Goal: 6 (29/56/21 3086)  Complications: No complications documented.

## 2020-03-25 NOTE — Anesthesia Procedure Notes (Signed)
Procedure Name: LMA Insertion Performed by: Tacy Learn, CRNA Pre-anesthesia Checklist: Patient identified, Emergency Drugs available, Suction available, Patient being monitored and Timeout performed Patient Re-evaluated:Patient Re-evaluated prior to induction Preoxygenation: Pre-oxygenation with 100% oxygen Induction Type: IV induction LMA: LMA inserted LMA Size: 4.0 Number of attempts: 1 Placement Confirmation: positive ETCO2,  CO2 detector and breath sounds checked- equal and bilateral Tube secured with: Tape Dental Injury: Teeth and Oropharynx as per pre-operative assessment

## 2020-03-25 NOTE — Anesthesia Postprocedure Evaluation (Signed)
Anesthesia Post Note  Patient: Susan Beck  Procedure(s) Performed: MASTECTOMY MODIFIED RADICAL (Left Breast)  Patient location during evaluation: PACU Anesthesia Type: General Level of consciousness: awake, oriented, awake and alert and patient cooperative Pain management: pain level controlled Vital Signs Assessment: post-procedure vital signs reviewed and stable Respiratory status: respiratory function stable, nonlabored ventilation, patient connected to nasal cannula oxygen and spontaneous breathing Cardiovascular status: blood pressure returned to baseline and stable Postop Assessment: no headache and no backache Anesthetic complications: no   No complications documented.   Last Vitals:  Vitals:   03/25/20 0715  BP: (!) 163/79  Pulse: 76  Resp: 18  Temp: 36.7 C  SpO2: 99%    Last Pain:  Vitals:   03/25/20 0715  TempSrc: Oral                 Tacy Learn

## 2020-03-25 NOTE — Plan of Care (Signed)
  Problem: Education: Goal: Knowledge of disease or condition will improve Outcome: Progressing   Problem: Activity: Goal: Ability to maintain or regain function will improve Outcome: Progressing   Problem: Clinical Measurements: Goal: Postoperative complications will be avoided or minimized Outcome: Progressing   Problem: Self-Concept: Goal: Ability to verbalize positive feelings about self will improve Outcome: Progressing   Problem: Pain Management: Goal: Expressions of feelings of enhanced comfort will increase Outcome: Progressing   Problem: Skin Integrity: Goal: Demonstration of wound healing without infection will improve Outcome: Progressing   Problem: Education: Goal: Knowledge of General Education information will improve Description: Including pain rating scale, medication(s)/side effects and non-pharmacologic comfort measures Outcome: Progressing   Problem: Health Behavior/Discharge Planning: Goal: Ability to manage health-related needs will improve Outcome: Progressing   Problem: Clinical Measurements: Goal: Ability to maintain clinical measurements within normal limits will improve Outcome: Progressing Goal: Will remain free from infection Outcome: Progressing Goal: Diagnostic test results will improve Outcome: Progressing Goal: Respiratory complications will improve Outcome: Progressing Goal: Cardiovascular complication will be avoided Outcome: Progressing   Problem: Activity: Goal: Risk for activity intolerance will decrease Outcome: Progressing   Problem: Nutrition: Goal: Adequate nutrition will be maintained Outcome: Progressing   Problem: Coping: Goal: Level of anxiety will decrease Outcome: Progressing   Problem: Elimination: Goal: Will not experience complications related to bowel motility Outcome: Progressing Goal: Will not experience complications related to urinary retention Outcome: Progressing   Problem: Pain Managment: Goal:  General experience of comfort will improve Outcome: Progressing   Problem: Safety: Goal: Ability to remain free from injury will improve Outcome: Progressing   Problem: Skin Integrity: Goal: Risk for impaired skin integrity will decrease Outcome: Progressing

## 2020-03-25 NOTE — Progress Notes (Signed)
BP cuff changed to large

## 2020-03-25 NOTE — Interval H&P Note (Signed)
History and Physical Interval Note:  03/25/2020 7:17 AM  Susan Beck  has presented today for surgery, with the diagnosis of Left Breast Cancer.  The various methods of treatment have been discussed with the patient and family. After consideration of risks, benefits and other options for treatment, the patient has consented to  Procedure(s): MASTECTOMY MODIFIED RADICAL (Left) as a surgical intervention.  The patient's history has been reviewed, patient examined, no change in status, stable for surgery.  I have reviewed the patient's chart and labs.  Questions were answered to the patient's satisfaction.     Aviva Signs

## 2020-03-25 NOTE — Anesthesia Preprocedure Evaluation (Addendum)
Anesthesia Evaluation  Patient identified by MRN, date of birth, ID band Patient awake    Reviewed: Allergy & Precautions, NPO status , Patient's Chart, lab work & pertinent test results  History of Anesthesia Complications Negative for: history of anesthetic complications  Airway Mallampati: II  TM Distance: >3 FB Neck ROM: Full    Dental  (+) Upper Dentures, Lower Dentures   Pulmonary shortness of breath and with exertion, former smoker,    Pulmonary exam normal breath sounds clear to auscultation       Cardiovascular METS: 3 - Mets hypertension, Pt. on medications +CHF  Normal cardiovascular exam Rhythm:Regular Rate:Normal - Systolic murmurs, - Diastolic murmurs, - Friction Rub, - Carotid Bruit, - Peripheral Edema and - Systolic Click EKG - NSR, old septal infarct? 12/2015 - stress test  Blood pressure demonstrated a hypertensive response to exercise.  There was no ST segment deviation noted during stress.  The study is normal. There are no perfusion defects.  This is a low risk study.  The left ventricular ejection fraction is hyperdynamic (>65%).     Neuro/Psych negative psych ROS   GI/Hepatic Neg liver ROS, GERD  Medicated,  Endo/Other  negative endocrine ROS  Renal/GU negative Renal ROS  negative genitourinary   Musculoskeletal negative musculoskeletal ROS (+)   Abdominal   Peds negative pediatric ROS (+)  Hematology negative hematology ROS (+)   Anesthesia Other Findings   Reproductive/Obstetrics negative OB ROS                          Anesthesia Physical Anesthesia Plan  ASA: III  Anesthesia Plan: General   Post-op Pain Management:    Induction: Intravenous  PONV Risk Score and Plan: 4 or greater and Ondansetron and Dexamethasone  Airway Management Planned: LMA  Additional Equipment:   Intra-op Plan:   Post-operative Plan: Extubation in OR  Informed  Consent: I have reviewed the patients History and Physical, chart, labs and discussed the procedure including the risks, benefits and alternatives for the proposed anesthesia with the patient or authorized representative who has indicated his/her understanding and acceptance.     Dental advisory given  Plan Discussed with: CRNA and Surgeon  Anesthesia Plan Comments:       Anesthesia Quick Evaluation

## 2020-03-26 ENCOUNTER — Encounter (HOSPITAL_COMMUNITY): Payer: Self-pay | Admitting: General Surgery

## 2020-03-26 LAB — BASIC METABOLIC PANEL
Anion gap: 7 (ref 5–15)
BUN: 17 mg/dL (ref 8–23)
CO2: 24 mmol/L (ref 22–32)
Calcium: 8.8 mg/dL — ABNORMAL LOW (ref 8.9–10.3)
Chloride: 105 mmol/L (ref 98–111)
Creatinine, Ser: 1.05 mg/dL — ABNORMAL HIGH (ref 0.44–1.00)
GFR calc Af Amer: 56 mL/min — ABNORMAL LOW (ref >60)
GFR calc non Af Amer: 48 mL/min — ABNORMAL LOW (ref >60)
Glucose, Bld: 122 mg/dL — ABNORMAL HIGH (ref 70–99)
Potassium: 4.2 mmol/L (ref 3.5–5.1)
Sodium: 136 mmol/L (ref 135–145)

## 2020-03-26 LAB — CBC
HCT: 36.1 % (ref 36.0–46.0)
Hemoglobin: 11.6 g/dL — ABNORMAL LOW (ref 12.0–15.0)
MCH: 29.7 pg (ref 26.0–34.0)
MCHC: 32.1 g/dL (ref 30.0–36.0)
MCV: 92.3 fL (ref 80.0–100.0)
Platelets: 260 10*3/uL (ref 150–400)
RBC: 3.91 MIL/uL (ref 3.87–5.11)
RDW: 15.1 % (ref 11.5–15.5)
WBC: 9 10*3/uL (ref 4.0–10.5)
nRBC: 0 % (ref 0.0–0.2)

## 2020-03-26 LAB — PHOSPHORUS: Phosphorus: 4.1 mg/dL (ref 2.5–4.6)

## 2020-03-26 LAB — MAGNESIUM: Magnesium: 2.1 mg/dL (ref 1.7–2.4)

## 2020-03-26 MED ORDER — TRAMADOL HCL 50 MG PO TABS: 50.0000 mg | ORAL_TABLET | Freq: Four times a day (QID) | ORAL | 0 refills | Status: DC | PRN

## 2020-03-26 NOTE — Evaluation (Signed)
Physical Therapy Evaluation Patient Details Name: Susan Beck MRN: [id number] DOB: 1932-01-27 Today's Date: 03/26/2020   History of Present Illness  86 year old black female recently diagnosed with lobular carcinoma of the left breast who underwent a left modified radical mastectomy on 03/25/2020.  She tolerated the surgery well.  Her postoperative course was remarkable for mild hematoma that developed underneath the flap.  This did drain out and the JP drain was flushed.  Clinical Impression  Physical therapy evaluation completed, patient is at baseline and no further PT services recommended at this time. Pt demonstrates good carryover with stretches in pain-free ROM to maintain tissue extensibility and avoid contractures. Patient discharged to care of nursing for ambulation daily as tolerated for length of stay.      Follow Up Recommendations No PT follow up    Equipment Recommendations  None recommended by PT    Recommendations for Other Services       Precautions / Restrictions Precautions Precautions: Other (comment) Precaution Comments: JP drain Required Braces or Orthoses: Other Brace Other Brace: compression binder when OOB Restrictions Weight Bearing Restrictions: No      Mobility  Bed Mobility Overal bed mobility: Modified Independent  General bed mobility comments: slight increased time with elevated HOB to come to sitting at EOB  Transfers Overall transfer level: Independent Equipment used: None  General transfer comment: good steadiness, able to rise from bed and toilet without BUE and no physical assistance  Ambulation/Gait Ambulation/Gait assistance: Independent Gait Distance (Feet): 20 Feet Assistive device: None Gait Pattern/deviations: WFL(Within Functional Limits) Gait velocity: slightly decreased   General Gait Details: slightly decreased cadence ambulating around room and to restroom, no AD, good bil foot clearance, no unsteadiness or  LOB  Stairs            Wheelchair Mobility    Modified Rankin (Stroke Patients Only)       Balance Overall balance assessment: Independent              Pertinent Vitals/Pain Pain Assessment: No/denies pain ("just a little tender, but no pain")    Home Living Family/patient expects to be discharged to:: Private residence Living Arrangements: Alone Available Help at Discharge: Family;Available PRN/intermittently Type of Home: House Home Access: Stairs to enter Entrance Stairs-Rails: None Entrance Stairs-Number of Steps: 1 Home Layout: One level Home Equipment: Cane - single point      Prior Function Level of Independence: Independent         Comments: Pt reports independent with ADLs, ambulates community distances without AD, drives. Sister will be staying with her for a few days while she heals.     Hand Dominance        Extremity/Trunk Assessment   Upper Extremity Assessment Upper Extremity Assessment: Overall WFL for tasks assessed    Lower Extremity Assessment Lower Extremity Assessment: Overall WFL for tasks assessed (AROM WNL, strength 4/5, denies numbness/tingling throughout BLE)    Cervical / Trunk Assessment Cervical / Trunk Assessment: Normal  Communication   Communication: No difficulties  Cognition Arousal/Alertness: Awake/alert Behavior During Therapy: WFL for tasks assessed/performed Overall Cognitive Status: Within Functional Limits for tasks assessed       General Comments      Exercises Other Exercises Other Exercises: angels - shoulder flexion/abduction in jumping jack motion in pain-free ROM, 5 reps Other Exercises: elbows adduct/abduct - hands behind head, bring elbows together, then open elbows apart in pain-free ROM, 5 reps   Assessment/Plan    PT Assessment Patent does not  need any further PT services  PT Problem List         PT Treatment Interventions      PT Goals (Current goals can be found in the Care  Plan section)  Acute Rehab PT Goals Patient Stated Goal: return home PT Goal Formulation: With patient Time For Goal Achievement: 03/26/20 Potential to Achieve Goals: Good    Frequency     Barriers to discharge        Co-evaluation               AM-PAC PT "6 Clicks" Mobility  Outcome Measure Help needed turning from your back to your side while in a flat bed without using bedrails?: A Little Help needed moving from lying on your back to sitting on the side of a flat bed without using bedrails?: A Little Help needed moving to and from a bed to a chair (including a wheelchair)?: None Help needed standing up from a chair using your arms (e.g., wheelchair or bedside chair)?: None Help needed to walk in hospital room?: None Help needed climbing 3-5 steps with a railing? : None 6 Click Score: 22    End of Session   Activity Tolerance: Patient tolerated treatment well Patient left: in chair;with call bell/phone within reach;with nursing/sitter in room Nurse Communication: Mobility status PT Visit Diagnosis: Other abnormalities of gait and mobility (R26.89)    Time: 4536-4680 PT Time Calculation (min) (ACUTE ONLY): 15 min   Charges:   PT Evaluation $PT Eval Low Complexity: 1 Low           Tori Landis Dowdy PT, DPT 03/26/20, 12:22 PM [phone]

## 2020-03-26 NOTE — Progress Notes (Signed)
Discharge teaching for post-op care and JP drain care provided. Patient verbalized and demonstrated understanding of incision and drain care. Discharge instructions reviewed with patient. Given AVS, prescription sent to [facility] by provider. Patient verbalized understanding of instructions, follow-up and to pick up prescription. HH RN arranged with [facility] per TOC. IV Site removed for discharge, no concerns with site. Dressing to JP drain clean, dry and intact. Patient in stable condition awaiting sister's arrival to transport her home.

## 2020-03-26 NOTE — TOC Transition Note (Signed)
Transition of Care [facility]) - CM/SW Discharge Note  Patient Details  Name: Susan Beck MRN: [id number] Date of Birth: November 12, 1931  [facility] CM/SW Contact:  Sherie Don, LCSW Phone Number: 03/26/2020, 12:18 PM  Clinical Narrative: Patient is an 86 year old female under observation for S/P left mastectomy. TOC received consult for [facility]. CSW made referral to [facility] with Alvis Lemmings. Hospitalist updated. CSW called patient's sister, Casper Harrison, to update her on [facility] services as CSW could not reach patient. TOC signing off.  Final next level of care: [place] Barriers to Discharge: Barriers Resolved  Patient Goals and CMS Choice Patient states their goals for this hospitalization and ongoing recovery are:: Discharge home with [facility] CMS Medicare.gov Compare Post Acute Care list provided to:: Patient Choice offered to / list presented to : Patient  Discharge Plan and Services        DME Arranged: N/A DME Agency: NA HH Arranged: RN [place] Agency: [place] Date [facility] Agency Contacted: 03/26/20 Time [place]: 1218 Representative spoke with at [place]: Georgina Snell  Readmission Risk Interventions No flowsheet data found.

## 2020-03-26 NOTE — Discharge Summary (Signed)
Physician Discharge Summary  Patient ID: GILA LAUF MRN: [id number] DOB/AGE: 84-19-1933 84 y.o.  Admit date: 03/25/2020 Discharge date: 03/26/2020  Admission Diagnoses: Lobular carcinoma, left breast  Discharge Diagnoses: Same  Active Problems:   Malignant neoplasm of upper-outer quadrant of left female breast ([place])   S/P left mastectomy Hypertension  Discharged Condition: good  Hospital Course: Patient is an 86 year old black female recently diagnosed with lobular carcinoma of the left breast who underwent a left modified radical mastectomy on 03/25/2020.  She tolerated the surgery well.  Her postoperative course was remarkable for mild hematoma that developed underneath the flap.  This did drain out and the JP drain was flushed.  I suspect the drain had a clot in it.  It is since working much better.  Her hematoma has decreased in size.  Patient denies any incisional pain.  She is asymptomatic.  She is being discharged home on 03/26/2020 in good and improving condition.  Home health has been ordered.  Treatments: surgery: Left modified radical mastectomy on 03/25/2020  Discharge Exam: Blood pressure (!) 132/73, pulse 66, temperature 99.1 F (37.3 C), temperature source Oral, resp. rate 20, height 5\' 5"  (1.651 m), weight 89.8 kg, SpO2 97 %. General appearance: alert, cooperative and no distress Resp: clear to auscultation bilaterally Breasts: Left mastectomy incision line healing well.  Some fluid under the flap but not tense.  JP drainage serosanguineous in nature. Cardio: regular rate and rhythm, S1, S2 normal, no murmur, click, rub or gallop  Disposition: Discharge disposition: 01-Home or Self Care       Discharge Instructions    Diet - low sodium heart healthy   Complete by: As directed    Increase activity slowly   Complete by: As directed      Allergies as of 03/26/2020   No Known Allergies     Medication List    TAKE these medications   acetaminophen 500 MG  tablet Commonly known as: TYLENOL Take 1,000 mg by mouth every 6 (six) hours as needed (for pain.).   amLODipine 5 MG tablet Commonly known as: NORVASC TAKE ONE TABLET BY MOUTH ONCE DAILY. What changed: when to take this   b complex vitamins capsule Take 1 capsule by mouth daily.   multivitamin with minerals Tabs tablet Take 1 tablet by mouth daily. Centrum Silver   omeprazole 20 MG capsule Commonly known as: PRILOSEC Take 20 mg by mouth at bedtime.   [facility] Colon Health Caps Take 1 capsule by mouth daily.   traMADol 50 MG tablet Commonly known as: Ultram Take 1 tablet (50 mg total) by mouth every 6 (six) hours as needed for moderate pain.   vitamin C 1000 MG tablet Take 1,000 mg by mouth daily.   Vitamin D3 50 MCG (2000 UT) Tabs Take 2,000 Units by mouth daily.       Follow-up Information    Virl Cagey, MD. Schedule an appointment as soon as possible for a visit on 04/02/2020.   Specialty: General Surgery Contact information: [address] [place] [address] [phone]               Signed: Aviva Signs 03/26/2020, 8:23 AM

## 2020-03-26 NOTE — Discharge Instructions (Signed)
Surgical Drain Home Care Surgical drains are used to remove extra fluid that normally builds up in a surgical wound after surgery. A surgical drain helps to heal a surgical wound. Different kinds of surgical drains include:  Active drains. These drains use suction to pull drainage away from the surgical wound. Drainage flows through a tube to a container outside of the body. With these drains, you need to keep the bulb or the drainage container flat (compressed) at all times, except while you empty it. Flattening the bulb or container creates suction.  Passive drains. These drains allow fluid to drain naturally, by gravity. Drainage flows through a tube to a bandage (dressing) or a container outside of the body. Passive drains do not need to be emptied. A drain is placed during surgery. Right after surgery, drainage is usually bright red and a little thicker than water. The drainage may gradually turn yellow or pink and become thinner. It is likely that your health care provider will remove the drain when the drainage stops or when the amount decreases to 1-2 Tbsp (15-30 mL) during a 24-hour period. Supplies needed:  Tape.  Germ-free cleaning solution (sterile saline).  Cotton swabs.  Split gauze drain sponge: 4 x 4 inches (10 x 10 cm).  Gauze square: 4 x 4 inches (10 x 10 cm). How to care for your surgical drain Care for your drain as told by your health care provider. This is important to help prevent infection. If your drain is placed at your back, or any other hard-to-reach area, ask another person to assist you in performing the following tasks: General care  Keep the skin around the drain dry and covered with a dressing at all times.  Check your drain area every day for signs of infection. Check for: ? Redness, swelling, or pain. ? Pus or a bad smell. ? Cloudy drainage. ? Tenderness or pressure at the drain exit site. Changing the dressing Follow instructions from your health care  provider about how to change your dressing. Change your dressing at least once a day. Change it more often if needed to keep the dressing dry. Make sure you: 1. Gather your supplies. 2. Wash your hands with soap and water before you change your dressing. If soap and water are not available, use hand sanitizer. 3. Remove the old dressing. Avoid using scissors to do that. 4. Wash your hands with soap and water again after removing the old dressing. 5. Use sterile saline to clean your skin around the drain. You may need to use a cotton swab to clean the skin. 6. Place the tube through the slit in a drain sponge. Place the drain sponge so that it covers your wound. 7. Place the gauze square or another drain sponge on top of the drain sponge that is on the wound. Make sure the tube is between those layers. 8. Tape the dressing to your skin. 9. Tape the drainage tube to your skin 1-2 inches (2.5-5 cm) below the place where the tube enters your body. Taping keeps the tube from pulling on any stitches (sutures) that you have. 10. Wash your hands with soap and water. 11. Write down the color of your drainage and how often you change your dressing. How to empty your active drain  1. Make sure that you have a measuring cup that you can empty your drainage into. 2. Wash your hands with soap and water. If soap and water are not available, use hand sanitizer. 3.   Loosen any pins or clips that hold the tube in place. 4. If your health care provider tells you to strip the tube to prevent clots and tube blockages: ? Hold the tube at the skin with one hand. Use your other hand to pinch the tubing with your thumb and first finger. ? Gently move your fingers down the tube while squeezing very lightly. This clears any drainage, clots, or tissue from the tube. ? You may need to do this several times each day to keep the tube clear. Do not pull on the tube. 5. Open the bulb cap or the drain plug. Do not touch the  inside of the cap or the bottom of the plug. 6. Turn the device upside down and gently squeeze. 7. Empty all of the drainage into the measuring cup. 8. Compress the bulb or the container and replace the cap or the plug. To compress the bulb or the container, squeeze it firmly in the middle while you close the cap or plug the container. 9. Write down the amount of drainage that you have in each 24-hour period. If you have less than 2 Tbsp (30 mL) of drainage during 24 hours, contact your health care provider. 10. Flush the drainage down the toilet. 11. Wash your hands with soap and water. Contact a health care provider if:  You have redness, swelling, or pain around your drain area.  You have pus or a bad smell coming from your drain area.  You have a fever or chills.  The skin around your drain is warm to the touch.  The amount of drainage that you have is increasing instead of decreasing.  You have drainage that is cloudy.  There is a sudden stop or a sudden decrease in the amount of drainage that you have.  Your drain tube falls out.  Your active drain does not stay compressed after you empty it. Summary  Surgical drains are used to remove extra fluid that normally builds up in a surgical wound after surgery.  Different kinds of surgical drains include active drains and passive drains. Active drains use suction to pull drainage away from the surgical wound, and passive drains allow fluid to drain naturally.  It is important to care for your drain to prevent infection. If your drain is placed at your back, or any other hard-to-reach area, ask another person to assist you.  Contact your health care provider if you have redness, swelling, or pain around your drain area. This information is not intended to replace advice given to you by your health care provider. Make sure you discuss any questions you have with your health care provider. Document Revised: 09/21/2018 Document  Reviewed: 09/21/2018 Elsevier Patient Education  2020 Elsevier Inc.   Total or Modified Radical Mastectomy, Care After This sheet gives you information about how to care for yourself after your procedure. Your health care provider may also give you more specific instructions. If you have problems or questions, contact your health care provider. What can I expect after the procedure? After the procedure, it is common to have:  Pain.  Numbness.  Stiffness in the arm or shoulder.  Feelings of stress, sadness, or depression. If the lymph nodes under your arm were removed, you may have arm swelling, weakness, or numbness on the same side of your body as your surgery. Follow these instructions at home: Incision care   Follow instructions from your health care provider about how to take care of your incision.   Make sure you: ? Wash your hands with soap and water before you change your bandage (dressing). If soap and water are not available, use hand sanitizer. ? Change your dressing as told by your health care provider. ? Leave stitches (sutures), skin glue, or adhesive strips in place. These skin closures may need to stay in place for 2 weeks or longer. If adhesive strip edges start to loosen and curl up, you may trim the loose edges. Do not remove adhesive strips completely unless your health care provider tells you to do that.  Check your incision area every day for signs of infection. Check for: ? Redness, swelling, or more pain. ? Fluid or blood. ? Warmth. ? Pus or a bad smell.  If you were sent home with a surgical drain in place, follow instructions from your health care provider about emptying it. Bathing  Do not take baths, swim, or use a hot tub until your health care provider approves. Ask your health care provider if you may take showers. You may only be allowed to take sponge baths. Activity  Return to your normal activities as told by your health care provider. Ask your  health care provider what activities are safe for you.  Avoid activities that take a lot of effort.  Be careful to avoid any activities that could cause an injury to your arm on the side of your surgery.  Do not lift anything that is heavier than 10 lb (4.5 kg), or the limit that you are told, until your health care provider says that it is safe.  Avoid lifting with the arm on the side of your surgery.  Do not carry heavy objects on your shoulder.  After your drain is removed, do exercises to prevent stiffness and swelling in your arm. Talk with your health care provider about which exercises are safe for you. General instructions  Take over-the-counter and prescription medicines only as told by your health care provider.  You may eat what you usually do.  Keep your arm raised (elevated) above the level of your heart when you are sitting or lying down.  Do not wear tight jewelry on your arm, wrist, or fingers on the side of your surgery.  You may be given a tight sleeve (compression bandage) to wear over your arm on the side of your surgery. Wear this sleeve as told by your health care provider.  Ask your health care provider when you can start wearing a bra or using a breast prosthesis.  Before you are involved in certain procedures such as giving blood or having your blood pressure checked, tell all your health care providers if lymph nodes under your arm were removed. This is important information. Follow-up  Keep all follow-up visits as told by your health care provider. This is important.  Get checked for extra fluid around your lymph nodes (lymphedema) as often as told by your health care provider. Contact a health care provider if:  You have a fever.  Your pain medicine is not working.  Your arm swelling, weakness, or numbness has not improved after a few weeks.  You have new swelling in your breast area or arm.  You have redness, swelling, or more pain in your  incision area.  You have fluid or blood coming from your incision.  Your incision feels warm to the touch.  You have pus or a bad smell coming from your incision. Get help right away if:  You have very bad pain   in your breast area or arm.  You have chest pain.  You have difficulty breathing. Summary  Follow instructions from your health care provider about how to take care of your incision. Check your incision area every day for signs of infection.  Ask your health care provider what activities are safe for you.  Keep all follow-up visits as told by your health care provider. This is important.  Make sure you know which symptoms should cause you to contact your health care provider or to get help right away. This information is not intended to replace advice given to you by your health care provider. Make sure you discuss any questions you have with your health care provider. Document Revised: 10/21/2018 Document Reviewed: 05/21/2017 Elsevier Patient Education  2020 Elsevier Inc.    

## 2020-03-27 DIAGNOSIS — Z9012 Acquired absence of left breast and nipple: Secondary | ICD-10-CM | POA: Diagnosis not present

## 2020-03-27 DIAGNOSIS — K219 Gastro-esophageal reflux disease without esophagitis: Secondary | ICD-10-CM | POA: Diagnosis not present

## 2020-03-27 DIAGNOSIS — K5900 Constipation, unspecified: Secondary | ICD-10-CM | POA: Diagnosis not present

## 2020-03-27 DIAGNOSIS — R350 Frequency of micturition: Secondary | ICD-10-CM | POA: Diagnosis not present

## 2020-03-27 DIAGNOSIS — I110 Hypertensive heart disease with heart failure: Secondary | ICD-10-CM | POA: Diagnosis not present

## 2020-03-27 DIAGNOSIS — Z483 Aftercare following surgery for neoplasm: Secondary | ICD-10-CM | POA: Diagnosis not present

## 2020-03-27 DIAGNOSIS — E049 Nontoxic goiter, unspecified: Secondary | ICD-10-CM | POA: Diagnosis not present

## 2020-03-27 DIAGNOSIS — C50412 Malignant neoplasm of upper-outer quadrant of left female breast: Secondary | ICD-10-CM | POA: Diagnosis not present

## 2020-03-27 DIAGNOSIS — I5032 Chronic diastolic (congestive) heart failure: Secondary | ICD-10-CM | POA: Diagnosis not present

## 2020-03-28 LAB — SURGICAL PATHOLOGY

## 2020-03-29 DIAGNOSIS — E049 Nontoxic goiter, unspecified: Secondary | ICD-10-CM | POA: Diagnosis not present

## 2020-03-29 DIAGNOSIS — K5900 Constipation, unspecified: Secondary | ICD-10-CM | POA: Diagnosis not present

## 2020-03-29 DIAGNOSIS — C50412 Malignant neoplasm of upper-outer quadrant of left female breast: Secondary | ICD-10-CM | POA: Diagnosis not present

## 2020-03-29 DIAGNOSIS — Z9012 Acquired absence of left breast and nipple: Secondary | ICD-10-CM | POA: Diagnosis not present

## 2020-03-29 DIAGNOSIS — I110 Hypertensive heart disease with heart failure: Secondary | ICD-10-CM | POA: Diagnosis not present

## 2020-03-29 DIAGNOSIS — I5032 Chronic diastolic (congestive) heart failure: Secondary | ICD-10-CM | POA: Diagnosis not present

## 2020-03-29 DIAGNOSIS — Z483 Aftercare following surgery for neoplasm: Secondary | ICD-10-CM | POA: Diagnosis not present

## 2020-03-29 DIAGNOSIS — K219 Gastro-esophageal reflux disease without esophagitis: Secondary | ICD-10-CM | POA: Diagnosis not present

## 2020-03-29 DIAGNOSIS — R350 Frequency of micturition: Secondary | ICD-10-CM | POA: Diagnosis not present

## 2020-03-29 LAB — SURGICAL PATHOLOGY

## 2020-04-02 ENCOUNTER — Ambulatory Visit (INDEPENDENT_AMBULATORY_CARE_PROVIDER_SITE_OTHER): Payer: Self-pay | Admitting: General Surgery

## 2020-04-02 ENCOUNTER — Other Ambulatory Visit: Payer: Self-pay

## 2020-04-02 ENCOUNTER — Encounter: Payer: Self-pay | Admitting: General Surgery

## 2020-04-02 VITALS — BP 152/72 | HR 68 | Temp 98.3°F | Resp 16 | Ht 65.0 in | Wt 188.0 lb

## 2020-04-02 DIAGNOSIS — C50412 Malignant neoplasm of upper-outer quadrant of left female breast: Secondary | ICD-10-CM | POA: Diagnosis not present

## 2020-04-02 DIAGNOSIS — K5900 Constipation, unspecified: Secondary | ICD-10-CM | POA: Diagnosis not present

## 2020-04-02 DIAGNOSIS — Z9012 Acquired absence of left breast and nipple: Secondary | ICD-10-CM | POA: Diagnosis not present

## 2020-04-02 DIAGNOSIS — Z483 Aftercare following surgery for neoplasm: Secondary | ICD-10-CM | POA: Diagnosis not present

## 2020-04-02 DIAGNOSIS — I5032 Chronic diastolic (congestive) heart failure: Secondary | ICD-10-CM | POA: Diagnosis not present

## 2020-04-02 DIAGNOSIS — R350 Frequency of micturition: Secondary | ICD-10-CM | POA: Diagnosis not present

## 2020-04-02 DIAGNOSIS — K219 Gastro-esophageal reflux disease without esophagitis: Secondary | ICD-10-CM | POA: Diagnosis not present

## 2020-04-02 DIAGNOSIS — I110 Hypertensive heart disease with heart failure: Secondary | ICD-10-CM | POA: Diagnosis not present

## 2020-04-02 DIAGNOSIS — E049 Nontoxic goiter, unspecified: Secondary | ICD-10-CM | POA: Diagnosis not present

## 2020-04-02 NOTE — Progress Notes (Signed)
[facility] Note   HPI:  86 y.o. Adult presents to clinic for post-op follow-up evaluation after left mastectomy. Patient reports she is doing well and the drain is continuing to put out at least 25cc of dark SS fluid. She denies drainage or pain from the incision.  Review of Systems:  Soreness  No drainage or redness All other review of systems: otherwise negative   Vital Signs:  BP (!) 152/72   Pulse 68   Temp 98.3 F (36.8 C) (Oral)   Resp 16   Ht 5\' 5"  (1.651 m)   Wt 188 lb (85.3 kg)   SpO2 96%   BMI 31.28 kg/m    Physical Exam:  Physical Exam Vitals reviewed.  HENT:     Head: Normocephalic.  Cardiovascular:     Rate and Rhythm: Normal rate.  Pulmonary:     Effort: Pulmonary effort is normal.  Chest:     Comments: Left mastectomy site c/d/i with staples, no erythema or drainage, 1/2 staples removed and drain stripped, dark SS fluid in bulb Neurological:     Mental Status: She is alert.     Assessment:  86 y.o. yo Adult s/p left mastectomy for invasive lobular carcinoma pT2, pN1mi.  Plan:  - Will refer to Oncology to further discuss options and plans for treatment   - Will follow up with Dr. Arnoldo Morale next week  - Continue drain care and record output of the drain daily. - Continue to keep the incision dry and covered. - Wear a binder as needed.    Future Appointments  Date Time Provider [place]  04/11/2020 11:15 AM Aviva Signs, MD [facility] None    All of the above recommendations were discussed with the patient, and all of patient's questions were answered to her expressed satisfaction.  Curlene Labrum, MD [facility] [address] [place], [place] [address] [phone] (office)

## 2020-04-02 NOTE — Patient Instructions (Addendum)
Continue drain care and record output of the drain daily. Continue to keep the incision dry and covered. Wear a binder as needed.  Referral to oncology for them to discuss next steps.

## 2020-04-05 ENCOUNTER — Encounter (HOSPITAL_COMMUNITY): Payer: Self-pay | Admitting: *Deleted

## 2020-04-05 ENCOUNTER — Encounter (HOSPITAL_COMMUNITY): Payer: Self-pay

## 2020-04-05 ENCOUNTER — Other Ambulatory Visit: Payer: Self-pay

## 2020-04-05 DIAGNOSIS — I110 Hypertensive heart disease with heart failure: Secondary | ICD-10-CM | POA: Diagnosis not present

## 2020-04-05 DIAGNOSIS — C50412 Malignant neoplasm of upper-outer quadrant of left female breast: Secondary | ICD-10-CM | POA: Diagnosis not present

## 2020-04-05 DIAGNOSIS — I5032 Chronic diastolic (congestive) heart failure: Secondary | ICD-10-CM | POA: Diagnosis not present

## 2020-04-05 DIAGNOSIS — K219 Gastro-esophageal reflux disease without esophagitis: Secondary | ICD-10-CM | POA: Diagnosis not present

## 2020-04-05 DIAGNOSIS — K5900 Constipation, unspecified: Secondary | ICD-10-CM | POA: Diagnosis not present

## 2020-04-05 DIAGNOSIS — E049 Nontoxic goiter, unspecified: Secondary | ICD-10-CM | POA: Diagnosis not present

## 2020-04-05 DIAGNOSIS — Z483 Aftercare following surgery for neoplasm: Secondary | ICD-10-CM | POA: Diagnosis not present

## 2020-04-05 DIAGNOSIS — Z9012 Acquired absence of left breast and nipple: Secondary | ICD-10-CM | POA: Diagnosis not present

## 2020-04-05 DIAGNOSIS — R350 Frequency of micturition: Secondary | ICD-10-CM | POA: Diagnosis not present

## 2020-04-05 NOTE — Progress Notes (Signed)
I called the patient today for an introductory phone call. I introduced myself and explained my role in the patient's care. I briefly explained what the patient can expect during her initial visit with Dr. Delton Coombes. The patient states that she does not currently have any barriers to care but is concerned about potential transportation needs. The patient asks how often she will have visits to the [facility] and if she will be able to drive herself. I explained that all treatment regimens and patients are different. I explained that once a treatment plan is decided upon by the patient and physician that we can discuss appointment frequency and ability to drive herself. Patient was given the opportunity to ask questions and all were answered to her satisfaction. I provided my contact information and encouraged the patient to call with any questions or concerns.

## 2020-04-08 ENCOUNTER — Inpatient Hospital Stay (HOSPITAL_COMMUNITY): Payer: Medicare PPO | Attending: Hematology | Admitting: Hematology

## 2020-04-08 ENCOUNTER — Ambulatory Visit: Payer: Medicare PPO | Admitting: Cardiology

## 2020-04-08 ENCOUNTER — Inpatient Hospital Stay (HOSPITAL_COMMUNITY): Payer: Medicare PPO

## 2020-04-08 ENCOUNTER — Other Ambulatory Visit: Payer: Self-pay

## 2020-04-08 ENCOUNTER — Encounter (HOSPITAL_COMMUNITY): Payer: Self-pay | Admitting: Hematology

## 2020-04-08 DIAGNOSIS — Z853 Personal history of malignant neoplasm of breast: Secondary | ICD-10-CM | POA: Insufficient documentation

## 2020-04-08 DIAGNOSIS — K219 Gastro-esophageal reflux disease without esophagitis: Secondary | ICD-10-CM | POA: Insufficient documentation

## 2020-04-08 DIAGNOSIS — Z9012 Acquired absence of left breast and nipple: Secondary | ICD-10-CM | POA: Insufficient documentation

## 2020-04-08 DIAGNOSIS — C50912 Malignant neoplasm of unspecified site of left female breast: Secondary | ICD-10-CM | POA: Diagnosis not present

## 2020-04-08 DIAGNOSIS — I509 Heart failure, unspecified: Secondary | ICD-10-CM | POA: Insufficient documentation

## 2020-04-08 DIAGNOSIS — Z79899 Other long term (current) drug therapy: Secondary | ICD-10-CM | POA: Diagnosis not present

## 2020-04-08 DIAGNOSIS — Z87891 Personal history of nicotine dependence: Secondary | ICD-10-CM | POA: Insufficient documentation

## 2020-04-08 DIAGNOSIS — Z803 Family history of malignant neoplasm of breast: Secondary | ICD-10-CM | POA: Insufficient documentation

## 2020-04-08 DIAGNOSIS — I10 Essential (primary) hypertension: Secondary | ICD-10-CM | POA: Insufficient documentation

## 2020-04-08 LAB — CBC WITH DIFFERENTIAL/PLATELET
Abs Immature Granulocytes: 0.04 10*3/uL (ref 0.00–0.07)
Basophils Absolute: 0.1 10*3/uL (ref 0.0–0.1)
Basophils Relative: 1 %
Eosinophils Absolute: 0 10*3/uL (ref 0.0–0.5)
Eosinophils Relative: 0 %
HCT: 41 % (ref 36.0–46.0)
Hemoglobin: 13.1 g/dL (ref 12.0–15.0)
Immature Granulocytes: 0 %
Lymphocytes Relative: 22 %
Lymphs Abs: 2 10*3/uL (ref 0.7–4.0)
MCH: 29.7 pg (ref 26.0–34.0)
MCHC: 32 g/dL (ref 30.0–36.0)
MCV: 93 fL (ref 80.0–100.0)
Monocytes Absolute: 0.5 10*3/uL (ref 0.1–1.0)
Monocytes Relative: 5 %
Neutro Abs: 6.7 10*3/uL (ref 1.7–7.7)
Neutrophils Relative %: 72 %
Platelets: 409 10*3/uL — ABNORMAL HIGH (ref 150–400)
RBC: 4.41 MIL/uL (ref 3.87–5.11)
RDW: 14.9 % (ref 11.5–15.5)
WBC: 9.2 10*3/uL (ref 4.0–10.5)
nRBC: 0 % (ref 0.0–0.2)

## 2020-04-08 LAB — COMPREHENSIVE METABOLIC PANEL
ALT: 22 U/L (ref 0–44)
AST: 20 U/L (ref 15–41)
Albumin: 4 g/dL (ref 3.5–5.0)
Alkaline Phosphatase: 60 U/L (ref 38–126)
Anion gap: 9 (ref 5–15)
BUN: 10 mg/dL (ref 8–23)
CO2: 26 mmol/L (ref 22–32)
Calcium: 9.9 mg/dL (ref 8.9–10.3)
Chloride: 103 mmol/L (ref 98–111)
Creatinine, Ser: 0.95 mg/dL (ref 0.44–1.00)
GFR calc Af Amer: >60 mL/min (ref >60)
GFR calc non Af Amer: 55 mL/min — ABNORMAL LOW (ref >60)
Glucose, Bld: 124 mg/dL — ABNORMAL HIGH (ref 70–99)
Potassium: 4.3 mmol/L (ref 3.5–5.1)
Sodium: 138 mmol/L (ref 135–145)
Total Bilirubin: 0.5 mg/dL (ref 0.3–1.2)
Total Protein: 8.1 g/dL (ref 6.5–8.1)

## 2020-04-08 LAB — VITAMIN D 25 HYDROXY (VIT D DEFICIENCY, FRACTURES): Vit D, 25-Hydroxy: 37.85 ng/mL (ref 30–100)

## 2020-04-08 MED ORDER — ANASTROZOLE 1 MG PO TABS: 1.0000 mg | ORAL_TABLET | Freq: Every day | ORAL | 3 refills | Status: DC

## 2020-04-08 NOTE — Progress Notes (Signed)
[facility] [address] [place] [address]   Patient Care Team: Rosita Fire, MD as PCP - General (Internal Medicine) Dishmon, Garwin Brothers, RN as Oncology Nurse Navigator (Oncology) Donetta Potts, RN as Oncology Nurse Navigator (Oncology)  CHIEF COMPLAINTS/PURPOSE OF CONSULTATION:  Newly diagnosed breast cancer  HISTORY OF PRESENTING ILLNESS:  Susan Beck 86 y.o. adult is here because of recent diagnosis of left breast cancer. She was referred by Dr. Curlene Labrum after having a left mastectomy on 03/25/2020 by Dr. Aviva Signs; initial diagnosis was by Dr. Rosita Fire with mammography on 01/30/2020.  Today she is accompanied by her sister. She has never had a breast biopsy prior to the last one; she has never had a DEXA scan. She denies any recent weight loss or new pains. She takes vitamin D daily.  Her sister had breast cancer treated in Nov 06, 2010; maternal uncle deceased from cancer. She lives at home and is able to do all of her chores. She used to work as a Pharmacist, hospital and later in administration. She stopped smoking in 11/06/1998 after smoking 1 PPD for 30 years.  I reviewed her records extensively and collaborated the history with the patient.  SUMMARY OF ONCOLOGIC HISTORY: Oncology History   No history exists.    In terms of breast cancer risk profile:  She menarched at early age of 61 and went to menopause at age 53.  She was never pregnant. She never received birth control pills.  She was never exposed to fertility medications or hormone replacement therapy.  She has positive family history of Breast cancer in her sister.  MEDICAL HISTORY:  Past Medical History:  Diagnosis Date  . Bowel obstruction ([place])   . CHF (congestive heart failure) ([place])   . Essential (primary) hypertension   . Gastro-esophageal reflux disease with esophagitis   . Nontoxic goiter, unspecified   . Other fatigue   . Shortness of breath     SURGICAL HISTORY: Past Surgical  History:  Procedure Laterality Date  . APPENDECTOMY    . CATARACT EXTRACTION, BILATERAL    . HERNIA REPAIR    . MASTECTOMY MODIFIED RADICAL Left 03/25/2020   Procedure: MASTECTOMY MODIFIED RADICAL;  Surgeon: Aviva Signs, MD;  Location: [facility];  Service: General;  Laterality: Left;  . THYROIDECTOMY      SOCIAL HISTORY: Social History   Socioeconomic History  . Marital status: Divorced    Spouse name: Not on file  . Number of children: Not on file  . Years of education: Not on file  . Highest education level: Not on file  Occupational History  . Occupation: retired  Tobacco Use  . Smoking status: Former Smoker    Packs/day: 1.00    Types: Cigarettes    Start date: 07/15/1954    Quit date: 07/16/1999    Years since quitting: 20.7  . Smokeless tobacco: Never Used  Substance and Sexual Activity  . Alcohol use: Not Currently    Alcohol/week: 0.0 standard drinks  . Drug use: Not Currently  . Sexual activity: Not Currently  Other Topics Concern  . Not on file  Social History Narrative  . Not on file   Social Determinants of Health   Financial Resource Strain: Low Risk   . Difficulty of Paying Living Expenses: Not hard at all  Food Insecurity: No Food Insecurity  . Worried About Charity fundraiser in the Last Year: Never true  . Ran Out of Food in the  Last Year: Never true  Transportation Needs: No Transportation Needs  . Lack of Transportation (Medical): No  . Lack of Transportation (Non-Medical): No  Physical Activity: Insufficiently Active  . Days of Exercise per Week: 2 days  . Minutes of Exercise per Session: 20 min  Stress: No Stress Concern Present  . Feeling of Stress : Only a little  Social Connections: Moderately Isolated  . Frequency of Communication with Friends and Family: More than three times a week  . Frequency of Social Gatherings with Friends and Family: More than three times a week  . Attends Religious Services: More than 4 times per year  .  Active Member of Clubs or Organizations: No  . Attends Archivist Meetings: Never  . Marital Status: Divorced  Human resources officer Violence: Not At Risk  . Fear of Current or Ex-Partner: No  . Emotionally Abused: No  . Physically Abused: No  . Sexually Abused: No    FAMILY HISTORY: Family History  Problem Relation Age of Onset  . Breast cancer Sister   . Heart disease Paternal Grandmother     ALLERGIES:  has No Known Allergies.  MEDICATIONS:  Current Outpatient Medications  Medication Sig Dispense Refill  . acetaminophen (TYLENOL) 500 MG tablet Take 1,000 mg by mouth every 6 (six) hours as needed (for pain.). (Patient not taking: Reported on 04/05/2020)    . amLODipine (NORVASC) 5 MG tablet TAKE ONE TABLET BY MOUTH ONCE DAILY. (Patient taking differently: Take 5 mg by mouth at bedtime. ) 30 tablet 0  . Ascorbic Acid (VITAMIN C) 1000 MG tablet Take 1,000 mg by mouth daily.    Marland Kitchen b complex vitamins capsule Take 1 capsule by mouth daily.    . Cholecalciferol (VITAMIN D3) 50 MCG (2000 UT) TABS Take 2,000 Units by mouth daily.    . Multiple Vitamin (MULTIVITAMIN WITH MINERALS) TABS tablet Take 1 tablet by mouth daily. Centrum Silver    . omeprazole (PRILOSEC) 20 MG capsule Take 20 mg by mouth at bedtime.     . Probiotic Product ([place]) CAPS Take 1 capsule by mouth daily.      No current facility-administered medications for this visit.    REVIEW OF SYSTEMS:   Review of Systems  Constitutional: Positive for appetite change (moderately decreased) and fatigue (moderate). Negative for unexpected weight change.  Respiratory: Positive for shortness of breath (w/ exertion).   Musculoskeletal: Negative for arthralgias and myalgias.  All other systems reviewed and are negative.   PHYSICAL EXAMINATION: ECOG PERFORMANCE STATUS: 1 - Symptomatic but completely ambulatory  Vitals:   04/08/20 0800  BP: (!) 157/62  Pulse: 79  Resp: 18  Temp: 97.8 F (36.6 C)  SpO2:  100%   Filed Weights   04/08/20 0800  Weight: 187 lb 12.8 oz (85.2 kg)   Physical Exam Vitals reviewed.  Constitutional:      Appearance: Normal appearance. She is obese.  Cardiovascular:     Rate and Rhythm: Normal rate and regular rhythm.     Pulses: Normal pulses.     Heart sounds: Normal heart sounds.  Pulmonary:     Effort: Pulmonary effort is normal.     Breath sounds: Normal breath sounds.  Chest:     Breasts:        Right: Normal. No swelling, mass, nipple discharge, skin change or tenderness.        Left: Absent.  Abdominal:     Palpations: Abdomen is soft. There is no hepatomegaly, splenomegaly  or mass.     Tenderness: There is no abdominal tenderness.  Lymphadenopathy:     Cervical: No cervical adenopathy.     Upper Body:     Right upper body: No supraclavicular or axillary adenopathy.     Left upper body: No supraclavicular or axillary adenopathy.  Neurological:     General: No focal deficit present.     Mental Status: She is alert and oriented to person, place, and time.  Psychiatric:        Mood and Affect: Mood normal.        Behavior: Behavior normal.      LABORATORY DATA:  I have reviewed the data as listed   RADIOGRAPHIC STUDIES: I have personally reviewed the radiological reports and agreed with the findings in the report.   ASSESSMENT:  1.  Stage II (PT2PN17m) left breast invasive lobular carcinoma: -Left breast biopsy on 02/13/2020 shows multifocal invasive lobular carcinoma of 12:30 position and 2:30-3:00, ER 90% positive, PR 100% positive, Ki-67 5%, HER-2 negative. -Left mastectomy on 03/25/2020 with 4.5 cm multifocal invasive lobular carcinoma, grade 2, LCIS, 1 lymph node with micrometastasis, margins negative.  2.  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 -Sister has breast cancer 9 years ago.  Maternal uncle had cancer unknown to the  patient.   PLAN:  1.  Stage II left breast invasive lobular carcinoma: -I have discussed findings of the mastectomy with the patient and her sister in detail. -Because of her age and histology, I did not recommend any adjuvant chemotherapy. -We talked about initiating antiestrogen therapy for 5 years.  We talked about side effects including but not limited to hot flashes, musculoskeletal symptoms, decreased bone mineral density among others. -We will start her on anastrozole 1 mg daily.  I have sent a prescription to her pharmacy. -We will schedule her for a bone density test.  I will reevaluate her in 1 month.  We will also check vitamin D levels at that time. -I will make a referral to radiation oncology.    All questions were answered. The patient knows to call the clinic with any problems, questions or concerns.    SDerek Jack MD 04/08/20 8:03 AM  [phone]  I, DMilinda Antis am acting as a scribe for Dr. SSanda Linger  I, SDerek JackMD, have reviewed the above documentation for accuracy and completeness, and I agree with the above.

## 2020-04-08 NOTE — Patient Instructions (Addendum)
[place] at [facility] Discharge Instructions  You were seen and examined today by Dr. Delton Coombes. Dr. Delton Coombes discussed your past medical history and family history.   You have been diagnosed with Stage II lobular left breast cancer. This breast cancer is often multifocal, meaning that this is often seen in multiple spots, as yours was present both in breast tissue and the lymph node.  Your cancer is feeds on the estrogen that your body produces. Dr. Delton Coombes has recommended an anti-estrogen pill, Anastrozole, that will block your cancer from returning by decreasing the estrogen present in your body, cutting off the cancer's supply.  Dr. Delton Coombes will refer you to Radiation Oncology. We will see you back in 1 month.   Thank you for choosing [place] at [facility] to provide your oncology and hematology care.  To afford each patient quality time with our provider, please arrive at least 15 minutes before your scheduled appointment time.   If you have a lab appointment with the [facility] please come in thru the Main Entrance and check in at the main information desk.  You need to re-schedule your appointment should you arrive 10 or more minutes late.  We strive to give you quality time with our providers, and arriving late affects you and other patients whose appointments are after yours.  Also, if you no show three or more times for appointments you may be dismissed from the clinic at the providers discretion.     Again, thank you for choosing [facility].  Our hope is that these requests will decrease the amount of time that you wait before being seen by our physicians.       _____________________________________________________________  Should you have questions after your visit to [facility], please contact our office at [phone] and follow the prompts.  Our office hours are 8:00 a.m. and 4:30 p.m.  Monday - Friday.  Please note that voicemails left after 4:00 p.m. may not be returned until the following business day.  We are closed weekends and major holidays.  You do have access to a nurse 24-7, just call the main number to the clinic [phone] and do not press any options, hold on the line and a nurse will answer the phone.    For prescription refill requests, have your pharmacy contact our office and allow 72 hours.    Due to Covid, you will need to wear a mask upon entering the hospital. If you do not have a mask, a mask will be given to you at the Main Entrance upon arrival. For doctor visits, patients may have 1 support person age 55 or older with them. For treatment visits, patients can not have anyone with them due to social distancing guidelines and our immunocompromised population.

## 2020-04-09 ENCOUNTER — Encounter (HOSPITAL_COMMUNITY): Payer: Self-pay | Admitting: Lab

## 2020-04-09 DIAGNOSIS — I5032 Chronic diastolic (congestive) heart failure: Secondary | ICD-10-CM | POA: Diagnosis not present

## 2020-04-09 DIAGNOSIS — R350 Frequency of micturition: Secondary | ICD-10-CM | POA: Diagnosis not present

## 2020-04-09 DIAGNOSIS — K219 Gastro-esophageal reflux disease without esophagitis: Secondary | ICD-10-CM | POA: Diagnosis not present

## 2020-04-09 DIAGNOSIS — I110 Hypertensive heart disease with heart failure: Secondary | ICD-10-CM | POA: Diagnosis not present

## 2020-04-09 DIAGNOSIS — C50412 Malignant neoplasm of upper-outer quadrant of left female breast: Secondary | ICD-10-CM | POA: Diagnosis not present

## 2020-04-09 DIAGNOSIS — K5900 Constipation, unspecified: Secondary | ICD-10-CM | POA: Diagnosis not present

## 2020-04-09 DIAGNOSIS — E049 Nontoxic goiter, unspecified: Secondary | ICD-10-CM | POA: Diagnosis not present

## 2020-04-09 DIAGNOSIS — Z483 Aftercare following surgery for neoplasm: Secondary | ICD-10-CM | POA: Diagnosis not present

## 2020-04-09 DIAGNOSIS — Z9012 Acquired absence of left breast and nipple: Secondary | ICD-10-CM | POA: Diagnosis not present

## 2020-04-09 NOTE — Progress Notes (Unsigned)
Referral to [facility].  Records faxed on 8/10

## 2020-04-11 ENCOUNTER — Encounter: Payer: Self-pay | Admitting: General Surgery

## 2020-04-11 ENCOUNTER — Ambulatory Visit (INDEPENDENT_AMBULATORY_CARE_PROVIDER_SITE_OTHER): Payer: Self-pay | Admitting: General Surgery

## 2020-04-11 ENCOUNTER — Other Ambulatory Visit: Payer: Self-pay

## 2020-04-11 VITALS — BP 174/74 | HR 74 | Temp 98.3°F | Resp 16 | Ht 65.0 in | Wt 187.0 lb

## 2020-04-11 DIAGNOSIS — Z09 Encounter for follow-up examination after completed treatment for conditions other than malignant neoplasm: Secondary | ICD-10-CM

## 2020-04-11 NOTE — Progress Notes (Signed)
Subjective:     Susan Beck  Patient here for wound check, status post left modified radical mastectomy.  She states she is doing well.  Drainage from her JP drain has decreased significantly.  She has been seen by Dr. Delton Coombes of oncology.  She will be started on an oral antiestrogen medication. Objective:    BP (!) 174/74   Pulse 74   Temp 98.3 F (36.8 C) (Oral)   Resp 16   Ht 5\' 5"  (1.651 m)   Wt 187 lb (84.8 kg)   SpO2 94%   BMI 31.12 kg/m   General:  alert, cooperative and no distress  Left mastectomy flaps less swollen.  No ecchymosis present.  Remaining staples removed.  JP drain removed.     Assessment:    Doing well postoperatively.    Plan:   Will see patient again in 1 month for follow-up.

## 2020-04-16 ENCOUNTER — Telehealth: Payer: Self-pay

## 2020-04-16 DIAGNOSIS — Z9012 Acquired absence of left breast and nipple: Secondary | ICD-10-CM | POA: Diagnosis not present

## 2020-04-16 DIAGNOSIS — K219 Gastro-esophageal reflux disease without esophagitis: Secondary | ICD-10-CM | POA: Diagnosis not present

## 2020-04-16 DIAGNOSIS — E049 Nontoxic goiter, unspecified: Secondary | ICD-10-CM | POA: Diagnosis not present

## 2020-04-16 DIAGNOSIS — C50412 Malignant neoplasm of upper-outer quadrant of left female breast: Secondary | ICD-10-CM | POA: Diagnosis not present

## 2020-04-16 DIAGNOSIS — K5900 Constipation, unspecified: Secondary | ICD-10-CM | POA: Diagnosis not present

## 2020-04-16 DIAGNOSIS — I5032 Chronic diastolic (congestive) heart failure: Secondary | ICD-10-CM | POA: Diagnosis not present

## 2020-04-16 DIAGNOSIS — R350 Frequency of micturition: Secondary | ICD-10-CM | POA: Diagnosis not present

## 2020-04-16 DIAGNOSIS — I110 Hypertensive heart disease with heart failure: Secondary | ICD-10-CM | POA: Diagnosis not present

## 2020-04-16 DIAGNOSIS — Z483 Aftercare following surgery for neoplasm: Secondary | ICD-10-CM | POA: Diagnosis not present

## 2020-04-16 NOTE — Telephone Encounter (Signed)
Patient had breast surgery 03/25/20. She states she did have some swelling at her last appointment, but she has stopped wearing the binder- we discussed wearing the binder/sports bra to apply some pressure as this does help with keeping the swelling down- denies fever, chills. No redness. If swelling worsens call office and may possibly add to schedule on Thursday.

## 2020-04-18 ENCOUNTER — Ambulatory Visit (HOSPITAL_COMMUNITY)
Admission: RE | Admit: 2020-04-18 | Discharge: 2020-04-18 | Disposition: A | Discharge status: Home / Self Care | Payer: Medicare PPO | Source: Ambulatory Visit | Attending: Hematology | Admitting: Hematology

## 2020-04-18 ENCOUNTER — Other Ambulatory Visit: Payer: Self-pay

## 2020-04-18 DIAGNOSIS — Z13820 Encounter for screening for osteoporosis: Secondary | ICD-10-CM | POA: Insufficient documentation

## 2020-04-18 DIAGNOSIS — Z780 Asymptomatic menopausal state: Secondary | ICD-10-CM | POA: Diagnosis not present

## 2020-04-18 DIAGNOSIS — C50912 Malignant neoplasm of unspecified site of left female breast: Secondary | ICD-10-CM | POA: Diagnosis not present

## 2020-04-18 DIAGNOSIS — M8589 Other specified disorders of bone density and structure, multiple sites: Secondary | ICD-10-CM | POA: Diagnosis not present

## 2020-04-18 DIAGNOSIS — M8580 Other specified disorders of bone density and structure, unspecified site: Secondary | ICD-10-CM | POA: Diagnosis not present

## 2020-04-24 DIAGNOSIS — C50912 Malignant neoplasm of unspecified site of left female breast: Secondary | ICD-10-CM | POA: Diagnosis not present

## 2020-04-26 DIAGNOSIS — K5900 Constipation, unspecified: Secondary | ICD-10-CM | POA: Diagnosis not present

## 2020-04-26 DIAGNOSIS — I5032 Chronic diastolic (congestive) heart failure: Secondary | ICD-10-CM | POA: Diagnosis not present

## 2020-04-26 DIAGNOSIS — C50412 Malignant neoplasm of upper-outer quadrant of left female breast: Secondary | ICD-10-CM | POA: Diagnosis not present

## 2020-04-26 DIAGNOSIS — Z9012 Acquired absence of left breast and nipple: Secondary | ICD-10-CM | POA: Diagnosis not present

## 2020-04-26 DIAGNOSIS — K219 Gastro-esophageal reflux disease without esophagitis: Secondary | ICD-10-CM | POA: Diagnosis not present

## 2020-04-26 DIAGNOSIS — R350 Frequency of micturition: Secondary | ICD-10-CM | POA: Diagnosis not present

## 2020-04-26 DIAGNOSIS — E049 Nontoxic goiter, unspecified: Secondary | ICD-10-CM | POA: Diagnosis not present

## 2020-04-26 DIAGNOSIS — Z483 Aftercare following surgery for neoplasm: Secondary | ICD-10-CM | POA: Diagnosis not present

## 2020-04-26 DIAGNOSIS — I110 Hypertensive heart disease with heart failure: Secondary | ICD-10-CM | POA: Diagnosis not present

## 2020-05-01 DIAGNOSIS — R350 Frequency of micturition: Secondary | ICD-10-CM | POA: Diagnosis not present

## 2020-05-01 DIAGNOSIS — Z483 Aftercare following surgery for neoplasm: Secondary | ICD-10-CM | POA: Diagnosis not present

## 2020-05-01 DIAGNOSIS — E049 Nontoxic goiter, unspecified: Secondary | ICD-10-CM | POA: Diagnosis not present

## 2020-05-01 DIAGNOSIS — I5032 Chronic diastolic (congestive) heart failure: Secondary | ICD-10-CM | POA: Diagnosis not present

## 2020-05-01 DIAGNOSIS — K5900 Constipation, unspecified: Secondary | ICD-10-CM | POA: Diagnosis not present

## 2020-05-01 DIAGNOSIS — I110 Hypertensive heart disease with heart failure: Secondary | ICD-10-CM | POA: Diagnosis not present

## 2020-05-01 DIAGNOSIS — Z9012 Acquired absence of left breast and nipple: Secondary | ICD-10-CM | POA: Diagnosis not present

## 2020-05-01 DIAGNOSIS — K219 Gastro-esophageal reflux disease without esophagitis: Secondary | ICD-10-CM | POA: Diagnosis not present

## 2020-05-01 DIAGNOSIS — C50412 Malignant neoplasm of upper-outer quadrant of left female breast: Secondary | ICD-10-CM | POA: Diagnosis not present

## 2020-05-02 DIAGNOSIS — C50912 Malignant neoplasm of unspecified site of left female breast: Secondary | ICD-10-CM | POA: Diagnosis not present

## 2020-05-09 ENCOUNTER — Other Ambulatory Visit: Payer: Self-pay

## 2020-05-09 ENCOUNTER — Encounter: Payer: Self-pay | Admitting: General Surgery

## 2020-05-09 ENCOUNTER — Ambulatory Visit (INDEPENDENT_AMBULATORY_CARE_PROVIDER_SITE_OTHER): Payer: Self-pay | Admitting: General Surgery

## 2020-05-09 VITALS — BP 172/85 | HR 81 | Temp 98.6°F | Resp 16 | Ht 65.0 in | Wt 187.0 lb

## 2020-05-09 DIAGNOSIS — Z09 Encounter for follow-up examination after completed treatment for conditions other than malignant neoplasm: Secondary | ICD-10-CM

## 2020-05-09 NOTE — Progress Notes (Signed)
Subjective:     Susan Beck  Patient returns to discuss whether or not to undergo radiation therapy.  She is not able to fully extend her left arm above her head, but she says this is improving.  She is doing her exercises.  She has no left arm swelling.  She has no other complaints. Objective:    BP (!) 172/85   Pulse 81   Temp 98.6 F (37 C) (Oral)   Resp 16   Ht 5\' 5"  (1.651 m)   Wt 187 lb (84.8 kg)   SpO2 97%   BMI 31.12 kg/m   General:  alert, cooperative and no distress  Left mastectomy site is healing well with some firmness of the subcutaneous tissue, but is decreased since her last saw her.  No dominant masses noted. Oncology and radiation oncology notes reviewed     Assessment:    Doing well postoperatively.    Plan:   Given the size of the tumor and the margins, I do feel that radiation therapy would be beneficial.  I did explain this to the patient.  I told her I could not give her 100% guarantee that I got all the cancer cells out.  I told her that if she is not tolerating radiation therapy, she could stop it.  All her questions were answered.  She is seeing Dr. Delton Beck next week.  Follow-up here as needed.

## 2020-05-15 ENCOUNTER — Other Ambulatory Visit: Payer: Self-pay

## 2020-05-15 ENCOUNTER — Inpatient Hospital Stay (HOSPITAL_COMMUNITY): Payer: Medicare PPO | Attending: Hematology | Admitting: Hematology

## 2020-05-15 VITALS — BP 154/68 | HR 69 | Temp 97.2°F | Resp 18 | Wt 187.0 lb

## 2020-05-15 DIAGNOSIS — C50412 Malignant neoplasm of upper-outer quadrant of left female breast: Secondary | ICD-10-CM | POA: Diagnosis not present

## 2020-05-15 DIAGNOSIS — I10 Essential (primary) hypertension: Secondary | ICD-10-CM | POA: Insufficient documentation

## 2020-05-15 DIAGNOSIS — C50912 Malignant neoplasm of unspecified site of left female breast: Secondary | ICD-10-CM

## 2020-05-15 DIAGNOSIS — Z87891 Personal history of nicotine dependence: Secondary | ICD-10-CM | POA: Insufficient documentation

## 2020-05-15 DIAGNOSIS — Z79811 Long term (current) use of aromatase inhibitors: Secondary | ICD-10-CM | POA: Diagnosis not present

## 2020-05-15 DIAGNOSIS — Z809 Family history of malignant neoplasm, unspecified: Secondary | ICD-10-CM | POA: Insufficient documentation

## 2020-05-15 DIAGNOSIS — R232 Flushing: Secondary | ICD-10-CM | POA: Diagnosis not present

## 2020-05-15 DIAGNOSIS — I509 Heart failure, unspecified: Secondary | ICD-10-CM | POA: Insufficient documentation

## 2020-05-15 DIAGNOSIS — Z170 Estrogen receptor positive status [ER+]: Secondary | ICD-10-CM | POA: Insufficient documentation

## 2020-05-15 DIAGNOSIS — Z803 Family history of malignant neoplasm of breast: Secondary | ICD-10-CM | POA: Diagnosis not present

## 2020-05-15 DIAGNOSIS — K219 Gastro-esophageal reflux disease without esophagitis: Secondary | ICD-10-CM | POA: Diagnosis not present

## 2020-05-15 NOTE — Progress Notes (Signed)
[address] [place], [place] [address]   CLINIC:  Medical Oncology/Hematology  PCP:  Rosita Fire, MD [place] / [place] [address] [phone]   REASON FOR VISIT:  Follow-up for left breast cancer  PRIOR THERAPY: Left mastectomy on 03/25/2020  NGS Results: Not done  CURRENT THERAPY: Arimidex daily  BRIEF ONCOLOGIC HISTORY:  Oncology History   No history exists.    CANCER STAGING: Cancer Staging Lobular carcinoma of breast, left ([place]) Staging form: Breast, AJCC 8th Edition - Clinical stage from 04/08/2020: Stage IIA (cT2, cN28m(sn), cM0, G2, ER+, PR+, HER2-) - Unsigned   INTERVAL HISTORY:  Susan Beck a 86y.o. adult, returns for routine follow-up of her left breast cancer. Susan Beck last seen on 04/08/2020.  Today she reports feeling good. She is tolerating Arimidex well and had a couple mild hot flashes, but denies having any new aches or pains.  She met with Drs. Bruin and Goetowski and was informed that she will need 30 treatments over 6 weeks. After speaking with Dr. JArnoldo Morale she will proceed with radiation therapy.   REVIEW OF SYSTEMS:  Review of Systems  Constitutional: Positive for appetite change (mildly decreased) and fatigue (moderate).  Endocrine: Positive for hot flashes (mild).  Genitourinary: Positive for difficulty urinating (lemon color).   Musculoskeletal: Negative for arthralgias and myalgias.  Psychiatric/Behavioral: Positive for sleep disturbance. The patient is nervous/anxious.   All other systems reviewed and are negative.   PAST MEDICAL/SURGICAL HISTORY:  Past Medical History:  Diagnosis Date  . Bowel obstruction ([place]   . CHF (congestive heart failure) ([place]   . Essential (primary) hypertension   . Gastro-esophageal reflux disease with esophagitis   . Nontoxic goiter, unspecified   . Other fatigue   . Shortness of breath    Past Surgical History:  Procedure Laterality Date    . APPENDECTOMY    . CATARACT EXTRACTION, BILATERAL    . HERNIA REPAIR    . MASTECTOMY MODIFIED RADICAL Left 03/25/2020   Procedure: MASTECTOMY MODIFIED RADICAL;  Surgeon: JAviva Signs MD;  Location: [facility];  Service: General;  Laterality: Left;  . SMALL INTESTINE SURGERY    . THYROIDECTOMY      SOCIAL HISTORY:  Social History   Socioeconomic History  . Marital status: Divorced    Spouse name: Not on file  . Number of children: Not on file  . Years of education: Not on file  . Highest education level: Not on file  Occupational History  . Occupation: retired  Tobacco Use  . Smoking status: Former Smoker    Packs/day: 1.00    Types: Cigarettes    Start date: 07/15/1954    Quit date: 07/16/1999    Years since quitting: 20.8  . Smokeless tobacco: Never Used  Substance and Sexual Activity  . Alcohol use: Not Currently    Alcohol/week: 0.0 standard drinks  . Drug use: Not Currently  . Sexual activity: Not Currently  Other Topics Concern  . Not on file  Social History Narrative  . Not on file   Social Determinants of Health   Financial Resource Strain: Low Risk   . Difficulty of Paying Living Expenses: Not hard at all  Food Insecurity: No Food Insecurity  . Worried About RCharity fundraiserin the Last Year: Never true  . Ran Out of Food in the Last Year: Never true  Transportation Needs: No Transportation Needs  . Lack of Transportation (Medical): No  .  Lack of Transportation (Non-Medical): No  Physical Activity: Insufficiently Active  . Days of Exercise per Week: 2 days  . Minutes of Exercise per Session: 20 min  Stress: No Stress Concern Present  . Feeling of Stress : Only a little  Social Connections: Moderately Isolated  . Frequency of Communication with Friends and Family: More than three times a week  . Frequency of Social Gatherings with Friends and Family: More than three times a week  . Attends Religious Services: More than 4 times per year  . Active  Member of Clubs or Organizations: No  . Attends Archivist Meetings: Never  . Marital Status: Divorced  Human resources officer Violence: Not At Risk  . Fear of Current or Ex-Partner: No  . Emotionally Abused: No  . Physically Abused: No  . Sexually Abused: No    FAMILY HISTORY:  Family History  Problem Relation Age of Onset  . Breast cancer Sister   . Heart disease Paternal Grandmother     CURRENT MEDICATIONS:  Current Outpatient Medications  Medication Sig Dispense Refill  . acetaminophen (TYLENOL) 500 MG tablet Take 1,000 mg by mouth every 6 (six) hours as needed (for pain.).     Marland Kitchen amLODipine (NORVASC) 5 MG tablet TAKE ONE TABLET BY MOUTH ONCE DAILY. (Patient taking differently: Take 5 mg by mouth at bedtime. ) 30 tablet 0  . anastrozole (ARIMIDEX) 1 MG tablet Take 1 tablet (1 mg total) by mouth daily. 30 tablet 3  . Ascorbic Acid (VITAMIN C) 1000 MG tablet Take 1,000 mg by mouth daily.    Marland Kitchen b complex vitamins capsule Take 1 capsule by mouth daily.    . Cholecalciferol (VITAMIN D3) 50 MCG (2000 UT) TABS Take 2,000 Units by mouth daily.    . Multiple Vitamin (MULTIVITAMIN WITH MINERALS) TABS tablet Take 1 tablet by mouth daily. Centrum Silver    . omeprazole (PRILOSEC) 20 MG capsule Take 20 mg by mouth at bedtime.     . Probiotic Product ([place]) CAPS Take 1 capsule by mouth daily.      No current facility-administered medications for this visit.    ALLERGIES:  No Known Allergies  PHYSICAL EXAM:  Performance status (ECOG): 1 - Symptomatic but completely ambulatory  Vitals:   05/15/20 1057  BP: (!) 154/68  Pulse: 69  Resp: 18  Temp: (!) 97.2 F (36.2 C)  SpO2: 99%   Wt Readings from Last 3 Encounters:  05/15/20 187 lb (84.8 kg)  05/09/20 187 lb (84.8 kg)  04/11/20 187 lb (84.8 kg)   Physical Exam Vitals reviewed.  Constitutional:      Appearance: Normal appearance. She is obese.  Cardiovascular:     Rate and Rhythm: Normal rate and regular  rhythm.     Pulses: Normal pulses.     Heart sounds: Normal heart sounds.  Pulmonary:     Effort: Pulmonary effort is normal.     Breath sounds: Normal breath sounds.  Musculoskeletal:     Right lower leg: Edema (trace) present.     Left lower leg: Edema (trace) present.  Neurological:     General: No focal deficit present.     Mental Status: She is alert and oriented to person, place, and time.  Psychiatric:        Mood and Affect: Mood normal.        Behavior: Behavior normal.      LABORATORY DATA:  I have reviewed the labs as listed.  CBC Latest Ref  Rng & Units 04/08/2020 03/26/2020 03/22/2020  WBC 4.0 - 10.5 K/uL 9.2 9.0 8.0  Hemoglobin 12.0 - 15.0 g/dL 13.1 11.6(L) 14.3  Hematocrit 36 - 46 % 41.0 36.1 44.1  Platelets 150 - 400 K/uL 409(H) 260 291   CMP Latest Ref Rng & Units 04/08/2020 03/26/2020 03/22/2020  Glucose 70 - 99 mg/dL 124(H) 122(H) 102(H)  BUN 8 - 23 mg/dL 10 17 11   Creatinine 0.44 - 1.00 mg/dL 0.95 1.05(H) 0.87  Sodium 135 - 145 mmol/L 138 136 138  Potassium 3.5 - 5.1 mmol/L 4.3 4.2 3.8  Chloride 98 - 111 mmol/L 103 105 105  CO2 22 - 32 mmol/L 26 24 25   Calcium 8.9 - 10.3 mg/dL 9.9 8.8(L) 9.2  Total Protein 6.5 - 8.1 g/dL 8.1 - 7.7  Total Bilirubin 0.3 - 1.2 mg/dL 0.5 - 0.6  Alkaline Phos 38 - 126 U/L 60 - 52  AST 15 - 41 U/L 20 - 21  ALT 0 - 44 U/L 22 - 24   Lab Results  Component Value Date   VD25OH 37.85 04/08/2020    DIAGNOSTIC IMAGING:  I have independently reviewed the scans and discussed with the patient. DG Bone Density  Result Date: 04/18/2020 EXAM: DUAL X-RAY ABSORPTIOMETRY (DXA) FOR BONE MINERAL DENSITY IMPRESSION: Your patient Susan Beck completed a BMD test on 04/18/2020 using the [place] (software version: 14.10) manufactured by [employer]. The following summarizes the results of our evaluation. Technologist: [facility] PATIENT BIOGRAPHICAL: Name: Susan Beck, Susan Beck Patient ID: [id number] Birth Date: 05-07-1932 Height:  65.0 in. Gender: Female Exam Date: 04/18/2020 Weight: 187.0 lbs. Indications: Advanced Age, Breast Ca, Post Menopausal Fractures: Treatments: Anastrozole, Multivitamin, Vitamin D DENSITOMETRY RESULTS: Site      Region     Measured Date Measured Age WHO Classification Young Adult T-score BMD         %Change vs. Previous Significant Change (*) [facility] Spine L1-L4 04/18/2020 85.9 Osteopenia -1.1 1.046 g/cm2 - - DualFemur Neck Right 04/18/2020 85.9 Osteopenia -2.2 0.733 g/cm2 - - ASSESSMENT: The BMD measured at Femur Neck Right is 0.733 g/cm2 with a T-score of -2.2. This patient is considered osteopenic according to [facility]) criteria. The scan quality is good. World Pharmacologist [facility]) criteria for post-menopausal, Caucasian Women: Normal:       T-score at or above -1 SD Osteopenia:   T-score between -1 and -2.5 SD Osteoporosis: T-score at or below -2.5 SD RECOMMENDATIONS: 1. All patients should optimize calcium and vitamin D intake. 2. Consider FDA-approved medical therapies in postmenopausal women and med aged 75 years and older, based on the following: a. A hip or vertebral (clinical or morphometric) fracture b. T-score< -2.5 at the femoral neck or spine after appropriate evaluation to exclude secondary causes c. Low bone mass (T-score between -1.0 and -2.5 at the femoral neck or spine) and a 10-year probability of a hip fracture > 3% or a 10-year probability of a major osteoporosis-related fracture > 20% based on the [place]-adapted WHO algorithm d. Clinician judgment and/or patient preferences may indicate treatment for people with 10-year fracture probabilities above or below these levels FOLLOW-UP: People with diagnosed cases of osteoporosis or at high risk for fracture should have regular bone mineral density tests. For patients eligible for Medicare, routine testing is allowed once every 2 years. The testing frequency can be increased to one year for patients who have rapidly progressing  disease, those who are receiving or discontinuing medical therapy to restore bone  mass, or have additional risk factors. FRAX* RESULTS:  (version: 3.5) 10-year Probability of Fracture1 Major Osteoporotic Fracture2 Hip Fracture 7.5% 2.4% Population: [place] (Black) Risk Factors: None Based on Femur (Right) Neck BMD 1 -The 10-year probability of fracture may be lower than reported if the patient has received treatment. 2 -Major Osteoporotic Fracture: Clinical Spine, Forearm, Hip or Shoulder *FRAX is a Materials engineer of the [employer] for Metabolic Bone Disease, a [place] (WHO) [facility]. ASSESSMENT: The probability of a major osteoporotic fracture is 7.5% within the next ten years. The probability of a hip fracture is 2.4% within the next ten years. Electronically Signed   By: Marlaine Hind M.D.   On: 04/18/2020 13:40     ASSESSMENT:  1.  Stage II (PT2PN91m) left breast invasive lobular carcinoma: -Left breast biopsy on 02/13/2020 shows multifocal invasive lobular carcinoma of 12:30 position and 2:30-3:00, ER 90% positive, PR 100% positive, Ki-67 5%, HER-2 negative. -Left mastectomy on 03/25/2020 with 4.5 cm multifocal invasive lobular carcinoma, grade 2, LCIS, 1 lymph node with micrometastasis, margins negative. -Anastrozole started on 04/08/2020.  2.  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 -Sister has breast cancer 9 years ago.  Maternal uncle had cancer unknown to the patient.   PLAN:  1.  Stage II left breast invasive lobular carcinoma: -She is tolerating anastrozole very well except minor hot flashes. -Reviewed labs which showed normal chemistries. -She is planning to initiate radiation therapy because of close margins. -I plan to see her back in 4 months with repeat labs and examination.  2.  Bone health: -DEXA scan on 04/18/2020 shows T score  -2.2. -Vitamin D is 37.  Recommended calcium and vitamin D supplements. -Plan to repeat DEXA scan in 2 years.  If there is any worsening consider Prolia. -Recommended weightbearing exercises.   Orders placed this encounter:  No orders of the defined types were placed in this encounter.    SDerek Jack MD [id number]  I, DMilinda Antis am acting as a scribe for Dr. SSanda Linger  I, SDerek JackMD, have reviewed the above documentation for accuracy and completeness, and I agree with the above.

## 2020-05-15 NOTE — Patient Instructions (Signed)
[place] at [facility] Discharge Instructions  You were seen today by Dr. Delton Coombes. He went over your recent results and scans; your bones are currently osteopenic. Be careful to not fall or fracture your bones and take calcium and vitamin D daily. Start doing light weight-bearing exercises to strengthen your bones. Dr. Delton Coombes will see you back in 4 months for labs and follow up.   Thank you for choosing [place] at [facility] to provide your oncology and hematology care.  To afford each patient quality time with our provider, please arrive at least 15 minutes before your scheduled appointment time.   If you have a lab appointment with the [place] please come in thru the Main Entrance and check in at the main information desk  You need to re-schedule your appointment should you arrive 10 or more minutes late.  We strive to give you quality time with our providers, and arriving late affects you and other patients whose appointments are after yours.  Also, if you no show three or more times for appointments you may be dismissed from the clinic at the providers discretion.     Again, thank you for choosing [facility].  Our hope is that these requests will decrease the amount of time that you wait before being seen by our physicians.       _____________________________________________________________  Should you have questions after your visit to [facility], please contact our office at [phone] between the hours of 8:00 a.m. and 4:30 p.m.  Voicemails left after 4:00 p.m. will not be returned until the following business day.  For prescription refill requests, have your pharmacy contact our office and allow 72 hours.    Cancer Center Support Programs:   > Cancer Support Group  2nd Tuesday of the month 1pm-2pm, Journey Room

## 2020-05-16 DIAGNOSIS — C50912 Malignant neoplasm of unspecified site of left female breast: Secondary | ICD-10-CM | POA: Diagnosis not present

## 2020-05-20 DIAGNOSIS — K219 Gastro-esophageal reflux disease without esophagitis: Secondary | ICD-10-CM | POA: Diagnosis not present

## 2020-05-20 DIAGNOSIS — N6320 Unspecified lump in the left breast, unspecified quadrant: Secondary | ICD-10-CM | POA: Diagnosis not present

## 2020-05-20 DIAGNOSIS — I10 Essential (primary) hypertension: Secondary | ICD-10-CM | POA: Diagnosis not present

## 2020-05-21 DIAGNOSIS — C50912 Malignant neoplasm of unspecified site of left female breast: Secondary | ICD-10-CM | POA: Diagnosis not present

## 2020-05-24 DIAGNOSIS — C50912 Malignant neoplasm of unspecified site of left female breast: Secondary | ICD-10-CM | POA: Diagnosis not present

## 2020-05-27 DIAGNOSIS — C50912 Malignant neoplasm of unspecified site of left female breast: Secondary | ICD-10-CM | POA: Diagnosis not present

## 2020-05-28 DIAGNOSIS — C50912 Malignant neoplasm of unspecified site of left female breast: Secondary | ICD-10-CM | POA: Diagnosis not present

## 2020-05-29 DIAGNOSIS — C50912 Malignant neoplasm of unspecified site of left female breast: Secondary | ICD-10-CM | POA: Diagnosis not present

## 2020-05-30 DIAGNOSIS — C50912 Malignant neoplasm of unspecified site of left female breast: Secondary | ICD-10-CM | POA: Diagnosis not present

## 2020-05-31 DIAGNOSIS — C50912 Malignant neoplasm of unspecified site of left female breast: Secondary | ICD-10-CM | POA: Diagnosis not present

## 2020-06-03 DIAGNOSIS — C50912 Malignant neoplasm of unspecified site of left female breast: Secondary | ICD-10-CM | POA: Diagnosis not present

## 2020-06-04 DIAGNOSIS — C50912 Malignant neoplasm of unspecified site of left female breast: Secondary | ICD-10-CM | POA: Diagnosis not present

## 2020-06-05 DIAGNOSIS — C50912 Malignant neoplasm of unspecified site of left female breast: Secondary | ICD-10-CM | POA: Diagnosis not present

## 2020-06-06 DIAGNOSIS — C50912 Malignant neoplasm of unspecified site of left female breast: Secondary | ICD-10-CM | POA: Diagnosis not present

## 2020-06-07 DIAGNOSIS — C50912 Malignant neoplasm of unspecified site of left female breast: Secondary | ICD-10-CM | POA: Diagnosis not present

## 2020-06-10 DIAGNOSIS — C50912 Malignant neoplasm of unspecified site of left female breast: Secondary | ICD-10-CM | POA: Diagnosis not present

## 2020-06-11 DIAGNOSIS — C50912 Malignant neoplasm of unspecified site of left female breast: Secondary | ICD-10-CM | POA: Diagnosis not present

## 2020-06-12 DIAGNOSIS — C50912 Malignant neoplasm of unspecified site of left female breast: Secondary | ICD-10-CM | POA: Diagnosis not present

## 2020-06-13 DIAGNOSIS — C50912 Malignant neoplasm of unspecified site of left female breast: Secondary | ICD-10-CM | POA: Diagnosis not present

## 2020-06-14 DIAGNOSIS — C50912 Malignant neoplasm of unspecified site of left female breast: Secondary | ICD-10-CM | POA: Diagnosis not present

## 2020-06-17 DIAGNOSIS — C50912 Malignant neoplasm of unspecified site of left female breast: Secondary | ICD-10-CM | POA: Diagnosis not present

## 2020-06-18 DIAGNOSIS — C50912 Malignant neoplasm of unspecified site of left female breast: Secondary | ICD-10-CM | POA: Diagnosis not present

## 2020-06-19 DIAGNOSIS — C50912 Malignant neoplasm of unspecified site of left female breast: Secondary | ICD-10-CM | POA: Diagnosis not present

## 2020-06-20 DIAGNOSIS — C50912 Malignant neoplasm of unspecified site of left female breast: Secondary | ICD-10-CM | POA: Diagnosis not present

## 2020-06-21 DIAGNOSIS — C50912 Malignant neoplasm of unspecified site of left female breast: Secondary | ICD-10-CM | POA: Diagnosis not present

## 2020-06-24 DIAGNOSIS — C50912 Malignant neoplasm of unspecified site of left female breast: Secondary | ICD-10-CM | POA: Diagnosis not present

## 2020-06-25 DIAGNOSIS — C50912 Malignant neoplasm of unspecified site of left female breast: Secondary | ICD-10-CM | POA: Diagnosis not present

## 2020-06-26 DIAGNOSIS — C50912 Malignant neoplasm of unspecified site of left female breast: Secondary | ICD-10-CM | POA: Diagnosis not present

## 2020-06-27 DIAGNOSIS — C50912 Malignant neoplasm of unspecified site of left female breast: Secondary | ICD-10-CM | POA: Diagnosis not present

## 2020-06-28 DIAGNOSIS — C50912 Malignant neoplasm of unspecified site of left female breast: Secondary | ICD-10-CM | POA: Diagnosis not present

## 2020-07-01 DIAGNOSIS — C50912 Malignant neoplasm of unspecified site of left female breast: Secondary | ICD-10-CM | POA: Diagnosis not present

## 2020-07-02 DIAGNOSIS — C50912 Malignant neoplasm of unspecified site of left female breast: Secondary | ICD-10-CM | POA: Diagnosis not present

## 2020-07-03 DIAGNOSIS — C50912 Malignant neoplasm of unspecified site of left female breast: Secondary | ICD-10-CM | POA: Diagnosis not present

## 2020-07-04 DIAGNOSIS — C50912 Malignant neoplasm of unspecified site of left female breast: Secondary | ICD-10-CM | POA: Diagnosis not present

## 2020-07-05 DIAGNOSIS — C50912 Malignant neoplasm of unspecified site of left female breast: Secondary | ICD-10-CM | POA: Diagnosis not present

## 2020-07-08 DIAGNOSIS — C50912 Malignant neoplasm of unspecified site of left female breast: Secondary | ICD-10-CM | POA: Diagnosis not present

## 2020-08-06 ENCOUNTER — Other Ambulatory Visit (HOSPITAL_COMMUNITY): Payer: Self-pay | Admitting: Hematology

## 2020-08-06 DIAGNOSIS — C50912 Malignant neoplasm of unspecified site of left female breast: Secondary | ICD-10-CM

## 2020-08-07 DIAGNOSIS — C50912 Malignant neoplasm of unspecified site of left female breast: Secondary | ICD-10-CM | POA: Diagnosis not present

## 2020-08-14 DIAGNOSIS — K219 Gastro-esophageal reflux disease without esophagitis: Secondary | ICD-10-CM | POA: Diagnosis not present

## 2020-08-14 DIAGNOSIS — R739 Hyperglycemia, unspecified: Secondary | ICD-10-CM | POA: Diagnosis not present

## 2020-08-14 DIAGNOSIS — C50912 Malignant neoplasm of unspecified site of left female breast: Secondary | ICD-10-CM | POA: Diagnosis not present

## 2020-08-14 DIAGNOSIS — Z1389 Encounter for screening for other disorder: Secondary | ICD-10-CM | POA: Diagnosis not present

## 2020-08-14 DIAGNOSIS — Z0001 Encounter for general adult medical examination with abnormal findings: Secondary | ICD-10-CM | POA: Diagnosis not present

## 2020-08-14 DIAGNOSIS — E785 Hyperlipidemia, unspecified: Secondary | ICD-10-CM | POA: Diagnosis not present

## 2020-08-14 DIAGNOSIS — Z1331 Encounter for screening for depression: Secondary | ICD-10-CM | POA: Diagnosis not present

## 2020-08-14 DIAGNOSIS — Z1329 Encounter for screening for other suspected endocrine disorder: Secondary | ICD-10-CM | POA: Diagnosis not present

## 2020-08-14 DIAGNOSIS — I10 Essential (primary) hypertension: Secondary | ICD-10-CM | POA: Diagnosis not present

## 2020-08-14 DIAGNOSIS — Z79899 Other long term (current) drug therapy: Secondary | ICD-10-CM | POA: Diagnosis not present

## 2020-08-22 DIAGNOSIS — I10 Essential (primary) hypertension: Secondary | ICD-10-CM | POA: Diagnosis not present

## 2020-08-22 DIAGNOSIS — Z6830 Body mass index (BMI) 30.0-30.9, adult: Secondary | ICD-10-CM | POA: Diagnosis not present

## 2020-08-22 DIAGNOSIS — R739 Hyperglycemia, unspecified: Secondary | ICD-10-CM | POA: Diagnosis not present

## 2020-09-17 ENCOUNTER — Inpatient Hospital Stay (HOSPITAL_COMMUNITY): Payer: Medicare PPO

## 2020-09-19 ENCOUNTER — Inpatient Hospital Stay (HOSPITAL_COMMUNITY): Payer: Medicare PPO | Attending: Hematology

## 2020-09-19 ENCOUNTER — Other Ambulatory Visit: Payer: Self-pay

## 2020-09-19 DIAGNOSIS — Z170 Estrogen receptor positive status [ER+]: Secondary | ICD-10-CM | POA: Insufficient documentation

## 2020-09-19 DIAGNOSIS — R232 Flushing: Secondary | ICD-10-CM | POA: Insufficient documentation

## 2020-09-19 DIAGNOSIS — Z803 Family history of malignant neoplasm of breast: Secondary | ICD-10-CM | POA: Diagnosis not present

## 2020-09-19 DIAGNOSIS — Z79899 Other long term (current) drug therapy: Secondary | ICD-10-CM | POA: Diagnosis not present

## 2020-09-19 DIAGNOSIS — Z79811 Long term (current) use of aromatase inhibitors: Secondary | ICD-10-CM | POA: Insufficient documentation

## 2020-09-19 DIAGNOSIS — C50412 Malignant neoplasm of upper-outer quadrant of left female breast: Secondary | ICD-10-CM | POA: Insufficient documentation

## 2020-09-19 DIAGNOSIS — C50912 Malignant neoplasm of unspecified site of left female breast: Secondary | ICD-10-CM

## 2020-09-19 LAB — CBC WITH DIFFERENTIAL/PLATELET
Abs Immature Granulocytes: 0.02 10*3/uL (ref 0.00–0.07)
Basophils Absolute: 0.1 10*3/uL (ref 0.0–0.1)
Basophils Relative: 1 %
Eosinophils Absolute: 0 10*3/uL (ref 0.0–0.5)
Eosinophils Relative: 0 %
HCT: 43.6 % (ref 36.0–46.0)
Hemoglobin: 14 g/dL (ref 12.0–15.0)
Immature Granulocytes: 0 %
Lymphocytes Relative: 26 %
Lymphs Abs: 1.8 10*3/uL (ref 0.7–4.0)
MCH: 29.1 pg (ref 26.0–34.0)
MCHC: 32.1 g/dL (ref 30.0–36.0)
MCV: 90.6 fL (ref 80.0–100.0)
Monocytes Absolute: 0.8 10*3/uL (ref 0.1–1.0)
Monocytes Relative: 11 %
Neutro Abs: 4.1 10*3/uL (ref 1.7–7.7)
Neutrophils Relative %: 62 %
Platelets: 342 10*3/uL (ref 150–400)
RBC: 4.81 MIL/uL (ref 3.87–5.11)
RDW: 14.5 % (ref 11.5–15.5)
WBC: 6.7 10*3/uL (ref 4.0–10.5)
nRBC: 0 % (ref 0.0–0.2)

## 2020-09-19 LAB — COMPREHENSIVE METABOLIC PANEL
ALT: 21 U/L (ref 0–44)
AST: 20 U/L (ref 15–41)
Albumin: 3.7 g/dL (ref 3.5–5.0)
Alkaline Phosphatase: 63 U/L (ref 38–126)
Anion gap: 9 (ref 5–15)
BUN: 12 mg/dL (ref 8–23)
CO2: 28 mmol/L (ref 22–32)
Calcium: 9.9 mg/dL (ref 8.9–10.3)
Chloride: 101 mmol/L (ref 98–111)
Creatinine, Ser: 0.88 mg/dL (ref 0.44–1.00)
GFR, Estimated: >60 mL/min (ref >60)
Glucose, Bld: 118 mg/dL — ABNORMAL HIGH (ref 70–99)
Potassium: 4 mmol/L (ref 3.5–5.1)
Sodium: 138 mmol/L (ref 135–145)
Total Bilirubin: 0.5 mg/dL (ref 0.3–1.2)
Total Protein: 8.1 g/dL (ref 6.5–8.1)

## 2020-09-19 LAB — VITAMIN D 25 HYDROXY (VIT D DEFICIENCY, FRACTURES): Vit D, 25-Hydroxy: 51.66 ng/mL (ref 30–100)

## 2020-09-22 DIAGNOSIS — K219 Gastro-esophageal reflux disease without esophagitis: Secondary | ICD-10-CM | POA: Diagnosis not present

## 2020-09-22 DIAGNOSIS — I10 Essential (primary) hypertension: Secondary | ICD-10-CM | POA: Diagnosis not present

## 2020-09-24 ENCOUNTER — Other Ambulatory Visit: Payer: Self-pay

## 2020-09-24 ENCOUNTER — Inpatient Hospital Stay (HOSPITAL_COMMUNITY): Payer: Medicare PPO | Admitting: Hematology

## 2020-09-24 VITALS — BP 150/81 | HR 93 | Temp 97.7°F | Resp 20 | Wt 181.8 lb

## 2020-09-24 DIAGNOSIS — Z170 Estrogen receptor positive status [ER+]: Secondary | ICD-10-CM | POA: Diagnosis not present

## 2020-09-24 DIAGNOSIS — Z803 Family history of malignant neoplasm of breast: Secondary | ICD-10-CM | POA: Diagnosis not present

## 2020-09-24 DIAGNOSIS — C50912 Malignant neoplasm of unspecified site of left female breast: Secondary | ICD-10-CM | POA: Diagnosis not present

## 2020-09-24 DIAGNOSIS — C50412 Malignant neoplasm of upper-outer quadrant of left female breast: Secondary | ICD-10-CM | POA: Diagnosis not present

## 2020-09-24 DIAGNOSIS — R232 Flushing: Secondary | ICD-10-CM | POA: Diagnosis not present

## 2020-09-24 DIAGNOSIS — Z79811 Long term (current) use of aromatase inhibitors: Secondary | ICD-10-CM | POA: Diagnosis not present

## 2020-09-24 DIAGNOSIS — Z79899 Other long term (current) drug therapy: Secondary | ICD-10-CM | POA: Diagnosis not present

## 2020-09-24 NOTE — Patient Instructions (Signed)
[place] at [facility] Discharge Instructions  You were seen today by Dr. Delton Coombes. He went over your recent results. You will be scheduled for a mammogram after 01/29/2021. Dr. Delton Coombes will see you back after the scan for labs and follow up.   Thank you for choosing [place] at [facility] to provide your oncology and hematology care.  To afford each patient quality time with our provider, please arrive at least 15 minutes before your scheduled appointment time.   If you have a lab appointment with the [place] please come in thru the Main Entrance and check in at the main information desk  You need to re-schedule your appointment should you arrive 10 or more minutes late.  We strive to give you quality time with our providers, and arriving late affects you and other patients whose appointments are after yours.  Also, if you no show three or more times for appointments you may be dismissed from the clinic at the providers discretion.     Again, thank you for choosing [facility].  Our hope is that these requests will decrease the amount of time that you wait before being seen by our physicians.       _____________________________________________________________  Should you have questions after your visit to [facility], please contact our office at [phone] between the hours of 8:00 a.m. and 4:30 p.m.  Voicemails left after 4:00 p.m. will not be returned until the following business day.  For prescription refill requests, have your pharmacy contact our office and allow 72 hours.    Cancer Center Support Programs:   > Cancer Support Group  2nd Tuesday of the month 1pm-2pm, Journey Room

## 2020-09-24 NOTE — Progress Notes (Signed)
[facility] [address] [place] [address]   Patient Care Team: Rosita Fire, MD as PCP - General (Internal Medicine) Brien Mates, RN as Oncology Nurse Navigator (Oncology) Donetta Potts, RN as Oncology Nurse Navigator (Oncology)  SUMMARY OF ONCOLOGIC HISTORY: Oncology History   No history exists.    CHIEF COMPLIANT: Follow-up for left breast cancer   INTERVAL HISTORY: [name] is a 87 y.o. adult here today for follow up of her left breast cancer. Her last visit was on 05/15/2020.   Today she reports feeling okay. She reports that she developed right hip pain several weeks ago which now predominates in her left lateral upper leg and hamstring. She is tolerating Arimidex well and reports having mild hot flashes mainly at night. She is taking Caltrate daily. She is taking Lasix for her leg swelling which helps.   REVIEW OF SYSTEMS:   Review of Systems  Constitutional: Positive for appetite change (50%) and fatigue.  Cardiovascular: Negative for leg swelling.  Endocrine: Positive for hot flashes (mild; at night).  Musculoskeletal: Positive for arthralgias (R hip & thigh pain).  All other systems reviewed and are negative.   I have reviewed the past medical history, past surgical history, social history and family history with the patient and they are unchanged from previous note.   ALLERGIES:   has No Known Allergies.   MEDICATIONS:  Current Outpatient Medications  Medication Sig Dispense Refill  . amLODipine (NORVASC) 5 MG tablet TAKE ONE TABLET BY MOUTH ONCE DAILY. (Patient taking differently: Take 5 mg by mouth at bedtime.) 30 tablet 0  . anastrozole (ARIMIDEX) 1 MG tablet Take 1 tablet (1 mg total) by mouth daily. 90 tablet 3  . Ascorbic Acid (VITAMIN C) 1000 MG tablet Take 1,000 mg by mouth daily.    Marland Kitchen b complex vitamins capsule Take 1 capsule by mouth daily.    . Cholecalciferol (VITAMIN D3) 50 MCG (2000 UT) TABS Take 2,000  Units by mouth daily.    . hydrochlorothiazide (HYDRODIURIL) 12.5 MG tablet Take 12.5 mg by mouth daily.    . Multiple Vitamin (MULTIVITAMIN WITH MINERALS) TABS tablet Take 1 tablet by mouth daily. Centrum Silver    . omeprazole (PRILOSEC) 20 MG capsule Take 20 mg by mouth at bedtime.     . Probiotic Product ([place]) CAPS Take 1 capsule by mouth daily.     Marland Kitchen acetaminophen (TYLENOL) 500 MG tablet Take 1,000 mg by mouth every 6 (six) hours as needed (for pain.).  (Patient not taking: Reported on 09/24/2020)     No current facility-administered medications for this visit.     PHYSICAL EXAMINATION: Performance status (ECOG): 1 - Symptomatic but completely ambulatory  Vitals:   09/24/20 1137  BP: (!) 150/81  Pulse: 93  Resp: 20  Temp: 97.7 F (36.5 C)  SpO2: 96%   Wt Readings from Last 3 Encounters:  09/24/20 181 lb 12.8 oz (82.5 kg)  05/15/20 187 lb (84.8 kg)  05/09/20 187 lb (84.8 kg)   Physical Exam Vitals reviewed.  Constitutional:      Appearance: Normal appearance.  Cardiovascular:     Rate and Rhythm: Normal rate and regular rhythm.     Pulses: Normal pulses.     Heart sounds: Normal heart sounds.  Pulmonary:     Effort: Pulmonary effort is normal.     Breath sounds: Normal breath sounds.  Chest:  Breasts:     Right: Normal. No  bleeding, inverted nipple, mass, nipple discharge, skin change, tenderness, axillary adenopathy or supraclavicular adenopathy.     Left: Absent. No mass, skin change, tenderness, axillary adenopathy or supraclavicular adenopathy.    Musculoskeletal:     Right lower leg: No edema.     Left lower leg: No edema.  Lymphadenopathy:     Upper Body:     Right upper body: No supraclavicular, axillary or pectoral adenopathy.     Left upper body: No supraclavicular, axillary or pectoral adenopathy.  Neurological:     General: No focal deficit present.     Mental Status: She is alert and oriented to person, place, and time.   Psychiatric:        Mood and Affect: Mood normal.        Behavior: Behavior normal.     Breast Exam Chaperone: Milinda Antis, MD     LABORATORY DATA:  I have reviewed the data as listed CMP Latest Ref Rng & Units 09/19/2020 04/08/2020 03/26/2020  Glucose 70 - 99 mg/dL 118(H) 124(H) 122(H)  BUN 8 - 23 mg/dL 12 10 17   Creatinine 0.44 - 1.00 mg/dL 0.88 0.95 1.05(H)  Sodium 135 - 145 mmol/L 138 138 136  Potassium 3.5 - 5.1 mmol/L 4.0 4.3 4.2  Chloride 98 - 111 mmol/L 101 103 105  CO2 22 - 32 mmol/L 28 26 24   Calcium 8.9 - 10.3 mg/dL 9.9 9.9 8.8(L)  Total Protein 6.5 - 8.1 g/dL 8.1 8.1 -  Total Bilirubin 0.3 - 1.2 mg/dL 0.5 0.5 -  Alkaline Phos 38 - 126 U/L 63 60 -  AST 15 - 41 U/L 20 20 -  ALT 0 - 44 U/L 21 22 -   No results found for: WVP710 Lab Results  Component Value Date   WBC 6.7 09/19/2020   HGB 14.0 09/19/2020   HCT 43.6 09/19/2020   MCV 90.6 09/19/2020   PLT 342 09/19/2020   NEUTROABS 4.1 09/19/2020   Lab Results  Component Value Date   VD25OH 51.66 09/19/2020   VD25OH 37.85 04/08/2020    ASSESSMENT:  1. Stage II (PT2PN75m) left breast invasive lobular carcinoma: -Left breast biopsy on 02/13/2020 shows multifocal invasive lobular carcinoma of 12:30 position and 2:30-3:00, ER 90% positive, PR 100% positive, Ki-67 5%, HER-2 negative. -Left mastectomy on 03/25/2020 with 4.5 cm multifocal invasive lobular carcinoma, grade 2, LCIS, 1 lymph node with micrometastasis, margins negative. -Anastrozole started on 04/08/2020.  2. 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 -Sister has breast cancer 9 years ago. Maternal uncle had cancer unknown to the patient.   PLAN:  1. Stage II left breast invasive lobular carcinoma: -She is tolerating anastrozole very well.  She has some hot flashes at nighttime. -Physical exam today did not reveal any palpable masses in the left  mastectomy area or in the right breast.  No palpable adenopathy. -Labs reviewed were normal. -We will schedule her for mammogram on 01/29/2021.  We will see her back after the mammogram.  2.  Bone health: -DEXA scan on 04/18/2020 with T score -2.2. -Vitamin D level is 51.6.  Continue calcium and vitamin D supplements. -Plan to repeat DEXA scan in 2 years.   Breast Cancer therapy associated bone loss: I have recommended calcium, Vitamin D and weight bearing exercises.   No orders of the defined types were placed in this encounter.  The patient has a good understanding of the  overall plan. she agrees with it. she will call with any problems that may develop before the next visit here.    Derek Jack, MD [facility] [phone]   I, Milinda Antis, am acting as a scribe for Dr. Sanda Linger.  I, Derek Jack MD, have reviewed the above documentation for accuracy and completeness, and I agree with the above.

## 2020-10-22 ENCOUNTER — Other Ambulatory Visit: Payer: Self-pay

## 2020-10-22 ENCOUNTER — Encounter: Payer: Self-pay | Admitting: Physician Assistant

## 2020-10-22 ENCOUNTER — Ambulatory Visit: Payer: Medicare PPO | Admitting: Physician Assistant

## 2020-10-22 VITALS — BP 148/80 | HR 84 | Ht 65.0 in | Wt 180.0 lb

## 2020-10-22 DIAGNOSIS — M79604 Pain in right leg: Secondary | ICD-10-CM

## 2020-10-22 DIAGNOSIS — I10 Essential (primary) hypertension: Secondary | ICD-10-CM

## 2020-10-22 DIAGNOSIS — I5032 Chronic diastolic (congestive) heart failure: Secondary | ICD-10-CM | POA: Diagnosis not present

## 2020-10-22 DIAGNOSIS — R0602 Shortness of breath: Secondary | ICD-10-CM

## 2020-10-22 DIAGNOSIS — Z9012 Acquired absence of left breast and nipple: Secondary | ICD-10-CM

## 2020-10-22 NOTE — Patient Instructions (Signed)
Medication Instructions:  Your physician recommends that you continue on your current medications as directed. Please refer to the Current Medication list given to you today.  *If you need a refill on your cardiac medications before your next appointment, please call your pharmacy*   Lab Work: None If you have labs (blood work) drawn today and your tests are completely normal, you will receive your results only by: Marland Kitchen MyChart Message (if you have MyChart) OR . A paper copy in the mail If you have any lab test that is abnormal or we need to change your treatment, we will call you to review the results.   Testing/Procedures: None   Follow-Up: At [facility], you and your health needs are our priority.  As part of our continuing mission to provide you with exceptional heart care, we have created designated Provider Care Teams.  These Care Teams include your primary Cardiologist (physician) and Advanced Practice Providers (APPs -  Physician Assistants and Nurse Practitioners) who all work together to provide you with the care you need, when you need it.  We recommend signing up for the patient portal called "MyChart".  Sign up information is provided on this After Visit Summary.  MyChart is used to connect with patients for Virtual Visits (Telemedicine).  Patients are able to view lab/test results, encounter notes, upcoming appointments, etc.  Non-urgent messages can be sent to your provider as well.   To learn more about what you can do with MyChart, go to [email or web address].    Your next appointment:   6 month(s)  The format for your next appointment:   In Person  Provider:   Carlyle Dolly, MD   Other Instructions None

## 2020-10-22 NOTE — Progress Notes (Signed)
Cardiology Office Note:    Date:  10/22/2020   ID:  KELSEY DURFLINGER, DOB 1932-07-21, MRN [id number]  PCP:  Rosita Fire, MD  Cardiologist:  Carlyle Dolly, MD   Referring MD: Rosita Fire, MD   Chief Complaint  Patient presents with  . Follow-up  SOB  History of Present Illness:    Susan Beck is a 87 y.o. adult with a hx of chronic dyspnea, HTN, chronic diastolic heart failure, and breast cancer. She has a history of chronic dyspnea. Echo 07/2015 wit EF 60-65% no WMA, and grade 1 DD. Nuclear stress test 12/2015 was nonischemic. She is intolerant to high dose of norvasc, tolerated 5 mg.   She was last seen by Dr. Harl Bowie 03/11/20 and was doing well at that time. She was cleared for mastectomy. She then completed 30 rounds of radiation - she drove herself to [facility] for these.   She presents today for follow up. She is significantly hypertensive today with SBP 170. She states she just started HCTZ which works for about 30 min in the morning. She brings a BP log that shows pressures primarily in the 130-140s. No headache or blurry vision.   Now that her breast cancer treatments are completed, she will focus on getting hearing aids.    Past Medical History:  Diagnosis Date  . Bowel obstruction ([place])   . CHF (congestive heart failure) ([place])   . Essential (primary) hypertension   . Gastro-esophageal reflux disease with esophagitis   . Nontoxic goiter, unspecified   . Other fatigue   . Shortness of breath     Past Surgical History:  Procedure Laterality Date  . APPENDECTOMY    . CATARACT EXTRACTION, BILATERAL    . HERNIA REPAIR    . MASTECTOMY MODIFIED RADICAL Left 03/25/2020   Procedure: MASTECTOMY MODIFIED RADICAL;  Surgeon: Aviva Signs, MD;  Location: [facility];  Service: General;  Laterality: Left;  . SMALL INTESTINE SURGERY    . THYROIDECTOMY      Current Medications: Current Meds  Medication Sig  . amLODipine (NORVASC) 5 MG tablet TAKE ONE TABLET BY MOUTH ONCE  DAILY. (Patient taking differently: Take 5 mg by mouth at bedtime.)  . anastrozole (ARIMIDEX) 1 MG tablet Take 1 tablet (1 mg total) by mouth daily.  . Ascorbic Acid (VITAMIN C) 1000 MG tablet Take 1,000 mg by mouth daily.  Marland Kitchen b complex vitamins capsule Take 1 capsule by mouth daily.  . Cholecalciferol (VITAMIN D3) 50 MCG (2000 UT) TABS Take 2,000 Units by mouth daily.  . hydrochlorothiazide (HYDRODIURIL) 12.5 MG tablet Take 12.5 mg by mouth daily.  . Multiple Vitamin (MULTIVITAMIN WITH MINERALS) TABS tablet Take 1 tablet by mouth daily. Centrum Silver  . omeprazole (PRILOSEC) 20 MG capsule Take 20 mg by mouth at bedtime.   . Probiotic Product ([place]) CAPS Take 1 capsule by mouth daily.      Allergies:   Patient has no known allergies.   Social History   Socioeconomic History  . Marital status: Divorced    Spouse name: Not on file  . Number of children: Not on file  . Years of education: Not on file  . Highest education level: Not on file  Occupational History  . Occupation: retired  Tobacco Use  . Smoking status: Former Smoker    Packs/day: 1.00    Types: Cigarettes    Start date: 07/15/1954    Quit date: 07/16/1999    Years since quitting: 21.2  . Smokeless  tobacco: Never Used  Vaping Use  . Vaping Use: Never used  Substance and Sexual Activity  . Alcohol use: Not Currently    Alcohol/week: 0.0 standard drinks  . Drug use: Not Currently  . Sexual activity: Not Currently  Other Topics Concern  . Not on file  Social History Narrative  . Not on file   Social Determinants of Health   Financial Resource Strain: Low Risk   . Difficulty of Paying Living Expenses: Not hard at all  Food Insecurity: No Food Insecurity  . Worried About Charity fundraiser in the Last Year: Never true  . Ran Out of Food in the Last Year: Never true  Transportation Needs: No Transportation Needs  . Lack of Transportation (Medical): No  . Lack of Transportation (Non-Medical): No   Physical Activity: Insufficiently Active  . Days of Exercise per Week: 2 days  . Minutes of Exercise per Session: 20 min  Stress: No Stress Concern Present  . Feeling of Stress : Only a little  Social Connections: Moderately Isolated  . Frequency of Communication with Friends and Family: More than three times a week  . Frequency of Social Gatherings with Friends and Family: More than three times a week  . Attends Religious Services: More than 4 times per year  . Active Member of Clubs or Organizations: No  . Attends Archivist Meetings: Never  . Marital Status: Divorced     Family History: The patient's family history includes Breast cancer in her sister; Heart disease in her paternal grandmother.  ROS:   Please see the history of present illness.     All other systems reviewed and are negative.  EKGs/Labs/Other Studies Reviewed:    The following studies were reviewed today:  Echo 2016: Study Conclusions   - Left ventricle: The cavity size was normal. Wall thickness was  increased in a pattern of mild LVH. Systolic function was normal.  The estimated ejection fraction was in the range of 60% to 65%.  Wall motion was normal; there were no regional wall motion  abnormalities. Doppler parameters are consistent with abnormal  left ventricular relaxation (grade 1 diastolic dysfunction).  Indeterminate filling pressures.  - Aortic valve: Aortic valve sclerosis without stenosis. Mildly  thickened, mildly calcified leaflets.  - Left atrium: The atrium was mildly dilated. Volume/bsa, ES,  (1-plane Simpson&'s, A2C): 30 ml/m^2.   EKG:  EKG is not ordered today.    Recent Labs: 03/26/2020: Magnesium 2.1 09/19/2020: ALT 21; BUN 12; Creatinine, Ser 0.88; Hemoglobin 14.0; Platelets 342; Potassium 4.0; Sodium 138  Recent Lipid Panel    Component Value Date/Time   CHOL 190 01/17/2019 0000   TRIG 94 01/17/2019 0000   HDL 46 01/17/2019 0000   LDLCALC 105  01/17/2019 0000    Physical Exam:    VS:  BP (!) 148/80   Pulse 84   Ht 5\' 5"  (1.651 m)   Wt 180 lb (81.6 kg)   SpO2 97%   BMI 29.95 kg/m     Wt Readings from Last 3 Encounters:  10/22/20 180 lb (81.6 kg)  09/24/20 181 lb 12.8 oz (82.5 kg)  05/15/20 187 lb (84.8 kg)     GEN: elderly female in NAD HEENT: Normal NECK: No JVD; No carotid bruits LYMPHATICS: No lymphadenopathy CARDIAC: RRR, no murmurs, rubs, gallops RESPIRATORY:  Clear to auscultation without rales, wheezing or rhonchi  ABDOMEN: Soft, non-tender, non-distended MUSCULOSKELETAL:  No edema; No deformity  SKIN: Warm and dry NEUROLOGIC:  Alert and oriented x 3 PSYCHIATRIC:  Normal affect   ASSESSMENT:    1. Essential hypertension   2. SOB (shortness of breath)   3. Chronic diastolic (congestive) heart failure ([place])   4. S/P left mastectomy   5. Pain of right lower extremity    PLAN:    In order of problems listed above:  Hypertension - 5 mg amlodipine + 12.5 mg HCTZ - elevated pressure with exertion - BP elevated as she was rushing in - recheck 148/80 - will hold off on medication changes    Chronic dyspnea - at baseline - she is interested in restarting her walking program this spring, as she recovers from radiation   Chronic diastolic heart failure - euvolemic today   Right leg pain - she has pain that radiates from her hip down her lower leg - strong pedal pulse, no pain with waling - outside thigh tender to palpiation - suspect sciatica   Follow up in 6 months.    Medication Adjustments/Labs and Tests Ordered: Current medicines are reviewed at length with the patient today.  Concerns regarding medicines are outlined above.  No orders of the defined types were placed in this encounter.  No orders of the defined types were placed in this encounter.   Signed, Ledora Bottcher, [place]  10/22/2020 11:19 AM    [facility]

## 2020-10-23 DIAGNOSIS — C50912 Malignant neoplasm of unspecified site of left female breast: Secondary | ICD-10-CM | POA: Diagnosis not present

## 2020-10-23 DIAGNOSIS — I10 Essential (primary) hypertension: Secondary | ICD-10-CM | POA: Diagnosis not present

## 2020-11-19 DIAGNOSIS — C50412 Malignant neoplasm of upper-outer quadrant of left female breast: Secondary | ICD-10-CM | POA: Diagnosis not present

## 2020-11-19 DIAGNOSIS — Z170 Estrogen receptor positive status [ER+]: Secondary | ICD-10-CM | POA: Diagnosis not present

## 2020-11-19 DIAGNOSIS — C50912 Malignant neoplasm of unspecified site of left female breast: Secondary | ICD-10-CM | POA: Diagnosis not present

## 2020-11-20 DIAGNOSIS — I10 Essential (primary) hypertension: Secondary | ICD-10-CM | POA: Diagnosis not present

## 2020-11-20 DIAGNOSIS — C50912 Malignant neoplasm of unspecified site of left female breast: Secondary | ICD-10-CM | POA: Diagnosis not present

## 2020-12-21 DIAGNOSIS — C50912 Malignant neoplasm of unspecified site of left female breast: Secondary | ICD-10-CM | POA: Diagnosis not present

## 2020-12-21 DIAGNOSIS — I10 Essential (primary) hypertension: Secondary | ICD-10-CM | POA: Diagnosis not present

## 2021-01-07 ENCOUNTER — Other Ambulatory Visit (HOSPITAL_COMMUNITY): Payer: Self-pay | Admitting: Hematology

## 2021-01-07 DIAGNOSIS — Z1231 Encounter for screening mammogram for malignant neoplasm of breast: Secondary | ICD-10-CM

## 2021-01-20 DIAGNOSIS — I10 Essential (primary) hypertension: Secondary | ICD-10-CM | POA: Diagnosis not present

## 2021-01-20 DIAGNOSIS — C50912 Malignant neoplasm of unspecified site of left female breast: Secondary | ICD-10-CM | POA: Diagnosis not present

## 2021-01-31 ENCOUNTER — Other Ambulatory Visit: Payer: Self-pay

## 2021-01-31 ENCOUNTER — Ambulatory Visit (HOSPITAL_COMMUNITY)
Admission: RE | Admit: 2021-01-31 | Discharge: 2021-01-31 | Disposition: A | Discharge status: Home / Self Care | Payer: Medicare PPO | Source: Ambulatory Visit | Attending: Hematology | Admitting: Hematology

## 2021-01-31 DIAGNOSIS — Z1231 Encounter for screening mammogram for malignant neoplasm of breast: Secondary | ICD-10-CM | POA: Insufficient documentation

## 2021-02-04 ENCOUNTER — Other Ambulatory Visit: Payer: Self-pay

## 2021-02-04 ENCOUNTER — Inpatient Hospital Stay (HOSPITAL_COMMUNITY): Payer: Medicare PPO | Attending: Hematology

## 2021-02-04 ENCOUNTER — Encounter (HOSPITAL_COMMUNITY): Payer: Medicare PPO

## 2021-02-04 DIAGNOSIS — Z170 Estrogen receptor positive status [ER+]: Secondary | ICD-10-CM | POA: Diagnosis not present

## 2021-02-04 DIAGNOSIS — C50912 Malignant neoplasm of unspecified site of left female breast: Secondary | ICD-10-CM | POA: Diagnosis not present

## 2021-02-04 DIAGNOSIS — Z87891 Personal history of nicotine dependence: Secondary | ICD-10-CM | POA: Diagnosis not present

## 2021-02-04 DIAGNOSIS — Z9012 Acquired absence of left breast and nipple: Secondary | ICD-10-CM | POA: Insufficient documentation

## 2021-02-04 DIAGNOSIS — Z79899 Other long term (current) drug therapy: Secondary | ICD-10-CM | POA: Diagnosis not present

## 2021-02-04 DIAGNOSIS — Z79811 Long term (current) use of aromatase inhibitors: Secondary | ICD-10-CM | POA: Diagnosis not present

## 2021-02-04 DIAGNOSIS — Z803 Family history of malignant neoplasm of breast: Secondary | ICD-10-CM | POA: Diagnosis not present

## 2021-02-04 DIAGNOSIS — M25539 Pain in unspecified wrist: Secondary | ICD-10-CM | POA: Insufficient documentation

## 2021-02-04 LAB — CBC WITH DIFFERENTIAL/PLATELET
Abs Immature Granulocytes: 0.02 10*3/uL (ref 0.00–0.07)
Basophils Absolute: 0.1 10*3/uL (ref 0.0–0.1)
Basophils Relative: 1 %
Eosinophils Absolute: 0 10*3/uL (ref 0.0–0.5)
Eosinophils Relative: 0 %
HCT: 43.2 % (ref 36.0–46.0)
Hemoglobin: 14 g/dL (ref 12.0–15.0)
Immature Granulocytes: 0 %
Lymphocytes Relative: 29 %
Lymphs Abs: 1.8 10*3/uL (ref 0.7–4.0)
MCH: 29.4 pg (ref 26.0–34.0)
MCHC: 32.4 g/dL (ref 30.0–36.0)
MCV: 90.6 fL (ref 80.0–100.0)
Monocytes Absolute: 0.6 10*3/uL (ref 0.1–1.0)
Monocytes Relative: 9 %
Neutro Abs: 3.9 10*3/uL (ref 1.7–7.7)
Neutrophils Relative %: 61 %
Platelets: 311 10*3/uL (ref 150–400)
RBC: 4.77 MIL/uL (ref 3.87–5.11)
RDW: 14.8 % (ref 11.5–15.5)
WBC: 6.4 10*3/uL (ref 4.0–10.5)
nRBC: 0 % (ref 0.0–0.2)

## 2021-02-04 LAB — COMPREHENSIVE METABOLIC PANEL
ALT: 18 U/L (ref 0–44)
AST: 17 U/L (ref 15–41)
Albumin: 3.9 g/dL (ref 3.5–5.0)
Alkaline Phosphatase: 57 U/L (ref 38–126)
Anion gap: 7 (ref 5–15)
BUN: 12 mg/dL (ref 8–23)
CO2: 27 mmol/L (ref 22–32)
Calcium: 9.4 mg/dL (ref 8.9–10.3)
Chloride: 104 mmol/L (ref 98–111)
Creatinine, Ser: 0.85 mg/dL (ref 0.44–1.00)
GFR, Estimated: >60 mL/min (ref >60)
Glucose, Bld: 112 mg/dL — ABNORMAL HIGH (ref 70–99)
Potassium: 3.8 mmol/L (ref 3.5–5.1)
Sodium: 138 mmol/L (ref 135–145)
Total Bilirubin: 0.7 mg/dL (ref 0.3–1.2)
Total Protein: 7.7 g/dL (ref 6.5–8.1)

## 2021-02-04 LAB — VITAMIN D 25 HYDROXY (VIT D DEFICIENCY, FRACTURES): Vit D, 25-Hydroxy: 44.44 ng/mL (ref 30–100)

## 2021-02-10 NOTE — Progress Notes (Signed)
[facility] [address], [place] [address]   Patient Care Team: Rosita Fire, MD as PCP - General (Internal Medicine) Harl Bowie Alphonse Guild, MD as PCP - Cardiology (Cardiology) Brien Mates, RN as Oncology Nurse Navigator (Oncology) Donetta Potts, RN as Oncology Nurse Navigator (Oncology)  SUMMARY OF ONCOLOGIC HISTORY: Oncology History   No history exists.    CHIEF COMPLIANT: Follow-up for left breast cancer   INTERVAL HISTORY: [name] is a 87 y.o. adult here today for follow up of her left breast cancer. Her last visit was on 09/24/2020.   Today she reports feeling well. She reports swelling and pain in her wrists.    REVIEW OF SYSTEMS:   Review of Systems  Constitutional:  Positive for appetite change (80%) and fatigue (80%).  Respiratory:  Positive for shortness of breath (w/ exertion).   Genitourinary:  Positive for difficulty urinating (urgency).   Musculoskeletal:  Positive for arthralgias (wrist 7/10).  All other systems reviewed and are negative.  I have reviewed the past medical history, past surgical history, social history and family history with the patient and they are unchanged from previous note.   ALLERGIES:   has No Known Allergies.   MEDICATIONS:  Current Outpatient Medications  Medication Sig Dispense Refill   amLODipine (NORVASC) 5 MG tablet TAKE ONE TABLET BY MOUTH ONCE DAILY. (Patient taking differently: Take 5 mg by mouth at bedtime.) 30 tablet 0   anastrozole (ARIMIDEX) 1 MG tablet Take 1 tablet (1 mg total) by mouth daily. 90 tablet 3   Ascorbic Acid (VITAMIN C) 1000 MG tablet Take 1,000 mg by mouth daily.     b complex vitamins capsule Take 1 capsule by mouth daily.     Cholecalciferol (VITAMIN D3) 50 MCG (2000 UT) TABS Take 2,000 Units by mouth daily.     hydrochlorothiazide (HYDRODIURIL) 12.5 MG tablet Take 12.5 mg by mouth daily.     Multiple Vitamin (MULTIVITAMIN WITH MINERALS) TABS tablet Take 1  tablet by mouth daily. Centrum Silver     omeprazole (PRILOSEC) 20 MG capsule Take 20 mg by mouth at bedtime.      Probiotic Product ([place]) CAPS Take 1 capsule by mouth daily.      No current facility-administered medications for this visit.     PHYSICAL EXAMINATION: Performance status (ECOG): 1 - Symptomatic but completely ambulatory  There were no vitals filed for this visit. Wt Readings from Last 3 Encounters:  10/22/20 180 lb (81.6 kg)  09/24/20 181 lb 12.8 oz (82.5 kg)  05/15/20 187 lb (84.8 kg)   Physical Exam Vitals reviewed.  Constitutional:      Appearance: Normal appearance.  Cardiovascular:     Rate and Rhythm: Normal rate and regular rhythm.     Pulses: Normal pulses.     Heart sounds: Normal heart sounds.  Pulmonary:     Effort: Pulmonary effort is normal.     Breath sounds: Normal breath sounds.  Chest:  Breasts:    Right: No inverted nipple, mass, nipple discharge, skin change or tenderness.     Left: No inverted nipple, mass, nipple discharge, skin change (MOQ lumpectomy scar well healed) or tenderness.  Abdominal:     Palpations: Abdomen is soft. There is no hepatomegaly, splenomegaly or mass.     Tenderness: There is no abdominal tenderness.  Neurological:     General: No focal deficit present.     Mental Status: She is alert and oriented  to person, place, and time.  Psychiatric:        Mood and Affect: Mood normal.        Behavior: Behavior normal.    Breast Exam Chaperone: Thana Ates     LABORATORY DATA:  I have reviewed the data as listed CMP Latest Ref Rng & Units 02/04/2021 09/19/2020 04/08/2020  Glucose 70 - 99 mg/dL 112(H) 118(H) 124(H)  BUN 8 - 23 mg/dL _0 Creatinine 0.44 - 1.00 mg/dL 0.85 0.88 0.95  Sodium 135 - 145 mmol/L 138 138 138  Potassium 3.5 - 5.1 mmol/L 3.8 4.0 4.3  Chloride 98 - 111 mmol/L 104 101 103  CO2 22 - 32 mmol/L _1 Calcium 8.9 - 10.3 mg/dL 9.4 9.9 9.9  Total Protein 6.5 - 8.1 g/dL 7.7  8.1 8.1  Total Bilirubin 0.3 - 1.2 mg/dL 0.7 0.5 0.5  Alkaline Phos 38 - 126 U/L 57 63 60  AST 15 - 41 U/L _2 ALT 0 - 44 U/L _3 No results found for: BMS111 Lab Results  Component Value Date   WBC 6.4 02/04/2021   HGB 14.0 02/04/2021   HCT 43.2 02/04/2021   MCV 90.6 02/04/2021   PLT 311 02/04/2021   NEUTROABS 3.9 02/04/2021    ASSESSMENT:  1.  Stage II (PT2PN3m) left breast invasive lobular carcinoma: -Left breast biopsy on 02/13/2020 shows multifocal invasive lobular carcinoma of 12:30 position and 2:30-3:00, ER 90% positive, PR 100% positive, Ki-67 5%, HER-2 negative. -Left mastectomy on 03/25/2020 with 4.5 cm multifocal invasive lobular carcinoma, grade 2, LCIS, 1 lymph node with micrometastasis, margins negative. -Anastrozole started on 04/08/2020.   2.  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 -Sister has breast cancer 9 years ago.  Maternal uncle had cancer unknown to the patient.   PLAN:  1.  Stage II left breast invasive lobular carcinoma: - Left mastectomy site is within normal limits.  Right breast has no palpable masses.  No palpable adenopathy. - She is tolerating anastrozole reasonably well. - She has occasional hand joint pains which are tolerable. - Reviewed labs from 02/04/2021 which showed normal LFTs and CBC. - Recommend 652-monthollow-up.  Reviewed right breast mammogram from 01/31/2021, BI-RADS Category 1.  She will continue to have right breast mammograms.   2.  Bone health: - DEXA scan on 04/18/2020 with T score -2.2. - Vitamin D level is 44.4. - Continue calcium and vitamin D.  Repeat DEXA scan in 2 years.   Breast Cancer therapy associated bone loss: I have recommended calcium, Vitamin D and weight bearing exercises.  Orders placed this encounter:  No orders of the defined types were placed in this encounter.   The patient has a good  understanding of the overall plan. She agrees with it. She will call with any problems that may develop before the next visit here.  SrDerek JackMD [phone] I, KiThana Atesam acting as a scribe for Dr. SrDerek Jack I, SrDerek JackD, have reviewed the above documentation for accuracy and completeness, and I agree with the above.

## 2021-02-11 ENCOUNTER — Other Ambulatory Visit: Payer: Self-pay

## 2021-02-11 ENCOUNTER — Inpatient Hospital Stay (HOSPITAL_BASED_OUTPATIENT_CLINIC_OR_DEPARTMENT_OTHER): Payer: Medicare PPO | Admitting: Hematology

## 2021-02-11 VITALS — BP 151/74 | HR 75 | Temp 96.8°F | Resp 18 | Wt 178.5 lb

## 2021-02-11 DIAGNOSIS — Z79899 Other long term (current) drug therapy: Secondary | ICD-10-CM | POA: Diagnosis not present

## 2021-02-11 DIAGNOSIS — Z170 Estrogen receptor positive status [ER+]: Secondary | ICD-10-CM | POA: Diagnosis not present

## 2021-02-11 DIAGNOSIS — C50912 Malignant neoplasm of unspecified site of left female breast: Secondary | ICD-10-CM | POA: Diagnosis not present

## 2021-02-11 DIAGNOSIS — Z9012 Acquired absence of left breast and nipple: Secondary | ICD-10-CM | POA: Diagnosis not present

## 2021-02-11 DIAGNOSIS — Z803 Family history of malignant neoplasm of breast: Secondary | ICD-10-CM | POA: Diagnosis not present

## 2021-02-11 DIAGNOSIS — Z79811 Long term (current) use of aromatase inhibitors: Secondary | ICD-10-CM | POA: Diagnosis not present

## 2021-02-11 DIAGNOSIS — Z87891 Personal history of nicotine dependence: Secondary | ICD-10-CM | POA: Diagnosis not present

## 2021-02-11 DIAGNOSIS — M25539 Pain in unspecified wrist: Secondary | ICD-10-CM | POA: Diagnosis not present

## 2021-02-11 NOTE — Patient Instructions (Signed)
[facility] at [facility] Discharge Instructions  You were seen today by Dr. Katragadda. He went over your recent results. Dr. Katragadda will see you back in 6 months for labs and follow up.   Thank you for choosing [facility] at [facility] to provide your oncology and hematology care.  To afford each patient quality time with our provider, please arrive at least 15 minutes before your scheduled appointment time.   If you have a lab appointment with the Cancer Center please come in thru the Main Entrance and check in at the main information desk  You need to re-schedule your appointment should you arrive 10 or more minutes late.  We strive to give you quality time with our providers, and arriving late affects you and other patients whose appointments are after yours.  Also, if you no show three or more times for appointments you may be dismissed from the clinic at the providers discretion.     Again, thank you for choosing [facility].  Our hope is that these requests will decrease the amount of time that you wait before being seen by our physicians.       _____________________________________________________________  Should you have questions after your visit to [facility], please contact our office at [phone] between the hours of 8:00 a.m. and 4:30 p.m.  Voicemails left after 4:00 p.m. will not be returned until the following business day.  For prescription refill requests, have your pharmacy contact our office and allow 72 hours.    Cancer Center Support Programs:   > Cancer Support Group  2nd Tuesday of the month 1pm-2pm, Journey Room    

## 2021-02-11 NOTE — Progress Notes (Signed)
Patient is taking anastrozole as prescribed and denies having any side effects.

## 2021-02-18 DIAGNOSIS — C50912 Malignant neoplasm of unspecified site of left female breast: Secondary | ICD-10-CM | POA: Diagnosis not present

## 2021-02-18 DIAGNOSIS — I10 Essential (primary) hypertension: Secondary | ICD-10-CM | POA: Diagnosis not present

## 2021-02-18 DIAGNOSIS — M10032 Idiopathic gout, left wrist: Secondary | ICD-10-CM | POA: Diagnosis not present

## 2021-02-19 DIAGNOSIS — C50412 Malignant neoplasm of upper-outer quadrant of left female breast: Secondary | ICD-10-CM | POA: Diagnosis not present

## 2021-02-19 DIAGNOSIS — Z9012 Acquired absence of left breast and nipple: Secondary | ICD-10-CM | POA: Diagnosis not present

## 2021-02-19 DIAGNOSIS — M25532 Pain in left wrist: Secondary | ICD-10-CM | POA: Diagnosis not present

## 2021-02-19 DIAGNOSIS — M25531 Pain in right wrist: Secondary | ICD-10-CM | POA: Diagnosis not present

## 2021-02-19 DIAGNOSIS — Z923 Personal history of irradiation: Secondary | ICD-10-CM | POA: Diagnosis not present

## 2021-02-19 DIAGNOSIS — Z170 Estrogen receptor positive status [ER+]: Secondary | ICD-10-CM | POA: Diagnosis not present

## 2021-02-19 DIAGNOSIS — Z79811 Long term (current) use of aromatase inhibitors: Secondary | ICD-10-CM | POA: Diagnosis not present

## 2021-02-19 DIAGNOSIS — L818 Other specified disorders of pigmentation: Secondary | ICD-10-CM | POA: Diagnosis not present

## 2021-02-19 DIAGNOSIS — Z9221 Personal history of antineoplastic chemotherapy: Secondary | ICD-10-CM | POA: Diagnosis not present

## 2021-03-20 DIAGNOSIS — I10 Essential (primary) hypertension: Secondary | ICD-10-CM | POA: Diagnosis not present

## 2021-03-20 DIAGNOSIS — C50912 Malignant neoplasm of unspecified site of left female breast: Secondary | ICD-10-CM | POA: Diagnosis not present

## 2021-04-20 DIAGNOSIS — I10 Essential (primary) hypertension: Secondary | ICD-10-CM | POA: Diagnosis not present

## 2021-04-20 DIAGNOSIS — K219 Gastro-esophageal reflux disease without esophagitis: Secondary | ICD-10-CM | POA: Diagnosis not present

## 2021-04-24 ENCOUNTER — Encounter: Payer: Self-pay | Admitting: Cardiology

## 2021-04-24 ENCOUNTER — Ambulatory Visit: Payer: Medicare PPO | Admitting: Cardiology

## 2021-04-24 ENCOUNTER — Other Ambulatory Visit: Payer: Self-pay

## 2021-04-24 VITALS — BP 158/80 | HR 73 | Ht 65.0 in | Wt 181.0 lb

## 2021-04-24 DIAGNOSIS — I10 Essential (primary) hypertension: Secondary | ICD-10-CM

## 2021-04-24 DIAGNOSIS — R0602 Shortness of breath: Secondary | ICD-10-CM

## 2021-04-24 MED ORDER — CHLORTHALIDONE 25 MG PO TABS: 12.5000 mg | ORAL_TABLET | Freq: Every day | ORAL | 3 refills | Status: DC

## 2021-04-24 NOTE — Progress Notes (Signed)
Clinical Summary Susan Beck is a 87 y.o.adult seen today for follow up of the following medical problems.   1. SOB -initial evaluation in 2016/2017 for reported SOB   - PFTs were normal.  -  12/2015 completed exercise nuclear stress modified Bruce protocol. She went 4 min and 2.3 METs, achieving her THR. No evidence of ischemia.  -07/2015  echo with LVEF 60-65%, no WMAs, grade I diastolic dysfunction.      - chronic stable SOB/DOE. She has always been relatively active for her age and has not been limited.    2. HTN - norvasc '10mg'$  caused dizziness and LE edema. Symptoms improved after decreasing norvasc to '5mg'$  daily. - compliant with meds   - home bp's usually 130s-150s/70s-80      3. Breast cancer - has completed surgery, radiation.      Lives alone, remains independent in all ADLs   Past Medical History:  Diagnosis Date   Bowel obstruction (HCC)    CHF (congestive heart failure) ([place])    Essential (primary) hypertension    Gastro-esophageal reflux disease with esophagitis    Nontoxic goiter, unspecified    Other fatigue    Shortness of breath      No Known Allergies   Current Outpatient Medications  Medication Sig Dispense Refill   amLODipine (NORVASC) 5 MG tablet TAKE ONE TABLET BY MOUTH ONCE DAILY. (Patient taking differently: Take 5 mg by mouth at bedtime.) 30 tablet 0   anastrozole (ARIMIDEX) 1 MG tablet Take 1 tablet (1 mg total) by mouth daily. 90 tablet 3   Ascorbic Acid (VITAMIN C) 1000 MG tablet Take 1,000 mg by mouth daily.     b complex vitamins capsule Take 1 capsule by mouth daily.     Cholecalciferol (VITAMIN D3) 50 MCG (2000 UT) TABS Take 2,000 Units by mouth daily.     hydrochlorothiazide (HYDRODIURIL) 12.5 MG tablet Take 12.5 mg by mouth daily.     Multiple Vitamin (MULTIVITAMIN WITH MINERALS) TABS tablet Take 1 tablet by mouth daily. Centrum Silver     omeprazole (PRILOSEC) 20 MG capsule Take 20 mg by mouth at bedtime.      Probiotic  Product ([place]) CAPS Take 1 capsule by mouth daily.      No current facility-administered medications for this visit.     Past Surgical History:  Procedure Laterality Date   APPENDECTOMY     CATARACT EXTRACTION, BILATERAL     HERNIA REPAIR     MASTECTOMY MODIFIED RADICAL Left 03/25/2020   Procedure: MASTECTOMY MODIFIED RADICAL;  Surgeon: Aviva Signs, MD;  Location: [facility];  Service: General;  Laterality: Left;   SMALL INTESTINE SURGERY     THYROIDECTOMY       No Known Allergies    Family History  Problem Relation Age of Onset   Breast cancer Sister    Heart disease Paternal Grandmother      Social History Susan Beck reports that she quit smoking about 21 years ago. Her smoking use included cigarettes. She started smoking about 66 years ago. She smoked an average of 1 pack per day. She has never used smokeless tobacco. Susan Beck reports that she does not currently use alcohol.   Review of Systems CONSTITUTIONAL: No weight loss, fever, chills, weakness or fatigue.  HEENT: Eyes: No visual loss, blurred vision, double vision or yellow sclerae.No hearing loss, sneezing, congestion, runny nose or sore throat.  SKIN: No rash or itching.  CARDIOVASCULAR: per hpi RESPIRATORY:  No shortness of breath, cough or sputum.  GASTROINTESTINAL: No anorexia, nausea, vomiting or diarrhea. No abdominal pain or blood.  GENITOURINARY: No burning on urination, no polyuria NEUROLOGICAL: No headache, dizziness, syncope, paralysis, ataxia, numbness or tingling in the extremities. No change in bowel or bladder control.  MUSCULOSKELETAL: No muscle, back pain, joint pain or stiffness.  LYMPHATICS: No enlarged nodes. No history of splenectomy.  PSYCHIATRIC: No history of depression or anxiety.  ENDOCRINOLOGIC: No reports of sweating, cold or heat intolerance. No polyuria or polydipsia.  Marland Kitchen   Physical Examination Today's Vitals   04/24/21 1300  BP: (!) 158/80  Pulse: 73   SpO2: 98%  Weight: 181 lb (82.1 kg)  Height: '5\' 5"'$  (1.651 m)   Body mass index is 30.12 kg/m.  Gen: resting comfortably, no acute distress HEENT: no scleral icterus, pupils equal round and reactive, no palptable cervical adenopathy,  CV: RRR, 2/6 systolic murmur apex, no jvd Resp: Clear to auscultation bilaterally GI: abdomen is soft, non-tender, non-distended, normal bowel sounds, no hepatosplenomegaly MSK: extremities are warm, no edema.  Skin: warm, no rash Neuro:  no focal deficits Psych: appropriate affect   Diagnostic Studies  07/2015 echo Study Conclusions  - Left ventricle: The cavity size was normal. Wall thickness was   increased in a pattern of mild LVH. Systolic function was normal.   The estimated ejection fraction was in the range of 60% to 65%.   Wall motion was normal; there were no regional wall motion   abnormalities. Doppler parameters are consistent with abnormal   left ventricular relaxation (grade 1 diastolic dysfunction).   Indeterminate filling pressures. - Aortic valve: Aortic valve sclerosis without stenosis. Mildly   thickened, mildly calcified leaflets. - Left atrium: The atrium was mildly dilated. Volume/bsa, ES,   (1-plane Simpson&'s, A2C): 30 ml/m^2.   10/2015 PFTs Normal     12/2015 MPI Blood pressure demonstrated a hypertensive response to exercise. There was no ST segment deviation noted during stress. The study is normal. There are no perfusion defects. This is a low risk study. The left ventricular ejection fraction is hyperdynamic (>65%).   Assessment and Plan   1. SOB -chronic symptoms unchanged, extensive workup 2016/2017 that was benign - remains relatively active for her age - monitor at this time, in absence of marked progression would not repeat testing.    2. HTN - above goal, will change HCTZ to chlorthalidone 12.'5mg'$  daily. Side effects on higher norvasc dosing in the past. Check BMET/Mg in 2 weeks, she will call with  bp in 2 weeks.    EKG today shows NSR     Arnoldo Lenis, M.D.

## 2021-04-24 NOTE — Patient Instructions (Signed)
Medication Instructions:  Your physician has recommended you make the following change in your medication:  STOP Hydrochlorothiazide  START Chlorthalidone 12.5 mg tablets daily  *If you need a refill on your cardiac medications before your next appointment, please call your pharmacy*   Lab Work: IN 2 WEEKS: BMET MAGNESIUM If you have labs (blood work) drawn today and your tests are completely normal, you will receive your results only by: [place] (if you have MyChart) OR A paper copy in the mail If you have any lab test that is abnormal or we need to change your treatment, we will call you to review the results.   Testing/Procedures: None   Follow-Up: At [facility], you and your health needs are our priority.  As part of our continuing mission to provide you with exceptional heart care, we have created designated Provider Care Teams.  These Care Teams include your primary Cardiologist (physician) and Advanced Practice Providers (APPs -  Physician Assistants and Nurse Practitioners) who all work together to provide you with the care you need, when you need it.  We recommend signing up for the patient portal called "MyChart".  Sign up information is provided on this After Visit Summary.  MyChart is used to connect with patients for Virtual Visits (Telemedicine).  Patients are able to view lab/test results, encounter notes, upcoming appointments, etc.  Non-urgent messages can be sent to your provider as well.   To learn more about what you can do with MyChart, go to [email or web address].    Your next appointment:   6 month(s)  The format for your next appointment:   In Person  Provider:   Carlyle Dolly, MD   Other Instructions Please keep a blood pressure log for two week and then call our office to update [place] with it.

## 2021-05-09 ENCOUNTER — Telehealth: Payer: Self-pay | Admitting: Cardiology

## 2021-05-09 NOTE — Telephone Encounter (Signed)
BP reading    The medication you changed her to is bringing it down    The last couple days its been around 139/76 hr 62 (this is today reading)

## 2021-05-09 NOTE — Telephone Encounter (Signed)
Spoke with pt who called to report BP Readings for the last 5 days.  9/9 139/76 62 9/8 147/78 64 ( Pt had to put batteries in monitor)  9/7 139/79 56 9/6 140/76 64 9/5 138/81 64  Pt states that she feels " very good". Please advise.

## 2021-05-12 ENCOUNTER — Other Ambulatory Visit (HOSPITAL_COMMUNITY)
Admission: RE | Admit: 2021-05-12 | Discharge: 2021-05-12 | Disposition: A | Discharge status: Home / Self Care | Payer: Medicare PPO | Source: Ambulatory Visit | Attending: Cardiology | Admitting: Cardiology

## 2021-05-12 DIAGNOSIS — R0602 Shortness of breath: Secondary | ICD-10-CM | POA: Insufficient documentation

## 2021-05-12 DIAGNOSIS — I10 Essential (primary) hypertension: Secondary | ICD-10-CM | POA: Diagnosis not present

## 2021-05-12 LAB — BASIC METABOLIC PANEL
Anion gap: 9 (ref 5–15)
BUN: 14 mg/dL (ref 8–23)
CO2: 27 mmol/L (ref 22–32)
Calcium: 9.8 mg/dL (ref 8.9–10.3)
Chloride: 100 mmol/L (ref 98–111)
Creatinine, Ser: 0.88 mg/dL (ref 0.44–1.00)
GFR, Estimated: >60 mL/min (ref >60)
Glucose, Bld: 147 mg/dL — ABNORMAL HIGH (ref 70–99)
Potassium: 3.6 mmol/L (ref 3.5–5.1)
Sodium: 136 mmol/L (ref 135–145)

## 2021-05-12 LAB — MAGNESIUM: Magnesium: 2 mg/dL (ref 1.7–2.4)

## 2021-05-13 NOTE — Telephone Encounter (Signed)
Reasonable bp's at this time, would not be too aggressive with medication changes to avoid medication side effects. No changes at this time   Zandra Abts MD

## 2021-05-13 NOTE — Telephone Encounter (Signed)
Pt notified of plan of care and voiced understanding

## 2021-05-14 ENCOUNTER — Other Ambulatory Visit (HOSPITAL_COMMUNITY): Payer: Self-pay | Admitting: Hematology

## 2021-05-14 DIAGNOSIS — C50912 Malignant neoplasm of unspecified site of left female breast: Secondary | ICD-10-CM

## 2021-05-21 DIAGNOSIS — I10 Essential (primary) hypertension: Secondary | ICD-10-CM | POA: Diagnosis not present

## 2021-05-21 DIAGNOSIS — C50912 Malignant neoplasm of unspecified site of left female breast: Secondary | ICD-10-CM | POA: Diagnosis not present

## 2021-06-03 DIAGNOSIS — M25531 Pain in right wrist: Secondary | ICD-10-CM | POA: Diagnosis not present

## 2021-06-03 DIAGNOSIS — Z170 Estrogen receptor positive status [ER+]: Secondary | ICD-10-CM | POA: Diagnosis not present

## 2021-06-03 DIAGNOSIS — L818 Other specified disorders of pigmentation: Secondary | ICD-10-CM | POA: Diagnosis not present

## 2021-06-03 DIAGNOSIS — Z9012 Acquired absence of left breast and nipple: Secondary | ICD-10-CM | POA: Diagnosis not present

## 2021-06-03 DIAGNOSIS — C50412 Malignant neoplasm of upper-outer quadrant of left female breast: Secondary | ICD-10-CM | POA: Diagnosis not present

## 2021-06-03 DIAGNOSIS — M25532 Pain in left wrist: Secondary | ICD-10-CM | POA: Diagnosis not present

## 2021-06-03 DIAGNOSIS — Z79811 Long term (current) use of aromatase inhibitors: Secondary | ICD-10-CM | POA: Diagnosis not present

## 2021-06-03 DIAGNOSIS — Z9221 Personal history of antineoplastic chemotherapy: Secondary | ICD-10-CM | POA: Diagnosis not present

## 2021-06-03 DIAGNOSIS — Z923 Personal history of irradiation: Secondary | ICD-10-CM | POA: Diagnosis not present

## 2021-06-20 DIAGNOSIS — C50912 Malignant neoplasm of unspecified site of left female breast: Secondary | ICD-10-CM | POA: Diagnosis not present

## 2021-06-20 DIAGNOSIS — I10 Essential (primary) hypertension: Secondary | ICD-10-CM | POA: Diagnosis not present

## 2021-07-21 DIAGNOSIS — I10 Essential (primary) hypertension: Secondary | ICD-10-CM | POA: Diagnosis not present

## 2021-07-21 DIAGNOSIS — C50912 Malignant neoplasm of unspecified site of left female breast: Secondary | ICD-10-CM | POA: Diagnosis not present

## 2021-08-11 ENCOUNTER — Inpatient Hospital Stay (HOSPITAL_COMMUNITY): Payer: Medicare PPO | Attending: Hematology

## 2021-08-11 DIAGNOSIS — M25531 Pain in right wrist: Secondary | ICD-10-CM | POA: Insufficient documentation

## 2021-08-11 DIAGNOSIS — C50912 Malignant neoplasm of unspecified site of left female breast: Secondary | ICD-10-CM

## 2021-08-11 DIAGNOSIS — Z803 Family history of malignant neoplasm of breast: Secondary | ICD-10-CM | POA: Insufficient documentation

## 2021-08-11 DIAGNOSIS — Z170 Estrogen receptor positive status [ER+]: Secondary | ICD-10-CM | POA: Diagnosis not present

## 2021-08-11 DIAGNOSIS — Z79811 Long term (current) use of aromatase inhibitors: Secondary | ICD-10-CM | POA: Diagnosis not present

## 2021-08-11 DIAGNOSIS — C50212 Malignant neoplasm of upper-inner quadrant of left female breast: Secondary | ICD-10-CM | POA: Insufficient documentation

## 2021-08-11 DIAGNOSIS — M25532 Pain in left wrist: Secondary | ICD-10-CM | POA: Insufficient documentation

## 2021-08-11 LAB — COMPREHENSIVE METABOLIC PANEL
ALT: 22 U/L (ref 0–44)
AST: 22 U/L (ref 15–41)
Albumin: 4 g/dL (ref 3.5–5.0)
Alkaline Phosphatase: 57 U/L (ref 38–126)
Anion gap: 9 (ref 5–15)
BUN: 13 mg/dL (ref 8–23)
CO2: 26 mmol/L (ref 22–32)
Calcium: 9.8 mg/dL (ref 8.9–10.3)
Chloride: 103 mmol/L (ref 98–111)
Creatinine, Ser: 0.94 mg/dL (ref 0.44–1.00)
GFR, Estimated: 59 mL/min — ABNORMAL LOW (ref >60)
Glucose, Bld: 129 mg/dL — ABNORMAL HIGH (ref 70–99)
Potassium: 3.7 mmol/L (ref 3.5–5.1)
Sodium: 138 mmol/L (ref 135–145)
Total Bilirubin: 0.8 mg/dL (ref 0.3–1.2)
Total Protein: 8 g/dL (ref 6.5–8.1)

## 2021-08-11 LAB — CBC WITH DIFFERENTIAL/PLATELET
Abs Immature Granulocytes: 0.02 10*3/uL (ref 0.00–0.07)
Basophils Absolute: 0.1 10*3/uL (ref 0.0–0.1)
Basophils Relative: 1 %
Eosinophils Absolute: 0 10*3/uL (ref 0.0–0.5)
Eosinophils Relative: 0 %
HCT: 43.4 % (ref 36.0–46.0)
Hemoglobin: 14.6 g/dL (ref 12.0–15.0)
Immature Granulocytes: 0 %
Lymphocytes Relative: 34 %
Lymphs Abs: 2 10*3/uL (ref 0.7–4.0)
MCH: 31 pg (ref 26.0–34.0)
MCHC: 33.6 g/dL (ref 30.0–36.0)
MCV: 92.1 fL (ref 80.0–100.0)
Monocytes Absolute: 0.5 10*3/uL (ref 0.1–1.0)
Monocytes Relative: 9 %
Neutro Abs: 3.3 10*3/uL (ref 1.7–7.7)
Neutrophils Relative %: 56 %
Platelets: 314 10*3/uL (ref 150–400)
RBC: 4.71 MIL/uL (ref 3.87–5.11)
RDW: 14.6 % (ref 11.5–15.5)
WBC: 5.9 10*3/uL (ref 4.0–10.5)
nRBC: 0 % (ref 0.0–0.2)

## 2021-08-11 LAB — VITAMIN D 25 HYDROXY (VIT D DEFICIENCY, FRACTURES): Vit D, 25-Hydroxy: 55.57 ng/mL (ref 30–100)

## 2021-08-17 NOTE — Progress Notes (Signed)
[facility] [address]  [address]   Patient Care Team: Rosita Fire, MD as PCP - General (Internal Medicine) Harl Bowie Alphonse Guild, MD as PCP - Cardiology (Cardiology) Brien Mates, RN as Oncology Nurse Navigator (Oncology) Donetta Potts, RN as Oncology Nurse Navigator (Oncology)  SUMMARY OF ONCOLOGIC HISTORY: Oncology History   No history exists.    CHIEF COMPLIANT: Follow-up for left breast cancer   INTERVAL HISTORY: [name] is a 87 y.o. adult here today for follow up of her left breast cancer. Her last visit was on 02/11/2021.   Today she reports feeling good. She is taking anastrozole and tolerating it well. She reports pain in her wrist bilaterally which is worse in her left wrist. She denies hot flashes.  REVIEW OF SYSTEMS:   Review of Systems  Constitutional:  Negative for appetite change (75%) and fatigue (50%).  Endocrine: Negative for hot flashes.  Musculoskeletal:  Positive for arthralgias (5/10 L wrist).  Psychiatric/Behavioral:  Positive for sleep disturbance.   All other systems reviewed and are negative.  I have reviewed the past medical history, past surgical history, social history and family history with the patient and they are unchanged from previous note.   ALLERGIES:   has No Known Allergies.   MEDICATIONS:  Current Outpatient Medications  Medication Sig Dispense Refill   amLODipine (NORVASC) 5 MG tablet TAKE ONE TABLET BY MOUTH ONCE DAILY. (Patient taking differently: Take 5 mg by mouth at bedtime.) 30 tablet 0   anastrozole (ARIMIDEX) 1 MG tablet TAKE ONE TABLET BY MOUTH DAILY 90 tablet 3   Ascorbic Acid (VITAMIN C) 1000 MG tablet Take 1,000 mg by mouth daily.     Multiple Vitamin (MULTIVITAMIN WITH MINERALS) TABS tablet Take 1 tablet by mouth daily. Centrum Silver     omeprazole (PRILOSEC) 20 MG capsule Take 20 mg by mouth at bedtime.      chlorthalidone (HYGROTON) 25 MG tablet Take 0.5 tablets  (12.5 mg total) by mouth daily. 45 tablet 3   No current facility-administered medications for this visit.     PHYSICAL EXAMINATION: Performance status (ECOG): 1 - Symptomatic but completely ambulatory  Vitals:   08/18/21 1146  BP: (!) 163/80  Pulse: 73  Resp: 18  Temp: 98.3 F (36.8 C)  SpO2: 100%   Wt Readings from Last 3 Encounters:  08/18/21 185 lb 1.6 oz (84 kg)  04/24/21 181 lb (82.1 kg)  02/11/21 178 lb 8 oz (81 kg)   Physical Exam Vitals reviewed.  Constitutional:      Appearance: Normal appearance.  Cardiovascular:     Rate and Rhythm: Normal rate and regular rhythm.     Pulses: Normal pulses.     Heart sounds: Normal heart sounds.  Pulmonary:     Effort: Pulmonary effort is normal.     Breath sounds: Normal breath sounds.  Chest:  Breasts:    Right: Normal. No swelling, bleeding, mass, skin change or tenderness.     Left: Normal. No swelling, bleeding, mass, skin change (outer quadrant lumpectomy scar within normal limits) or tenderness.  Abdominal:     Palpations: Abdomen is soft. There is no hepatomegaly, splenomegaly or mass.     Tenderness: There is no abdominal tenderness.  Musculoskeletal:     Right lower leg: No edema.     Left lower leg: No edema.  Lymphadenopathy:     Upper Body:     Right upper body: No supraclavicular, axillary or  pectoral adenopathy.     Left upper body: No supraclavicular, axillary or pectoral adenopathy.  Neurological:     General: No focal deficit present.     Mental Status: He is alert and oriented to person, place, and time.  Psychiatric:        Mood and Affect: Mood normal.        Behavior: Behavior normal.    Breast Exam Chaperone: Thana Ates     LABORATORY DATA:  I have reviewed the data as listed CMP Latest Ref Rng & Units 08/11/2021 05/12/2021 02/04/2021  Glucose 70 - 99 mg/dL 129(H) 147(H) 112(H)  BUN 8 - 23 mg/dL _0 Creatinine 0.44 - 1.00 mg/dL 0.94 0.88 0.85  Sodium 135 - 145 mmol/L 138 136 138   Potassium 3.5 - 5.1 mmol/L 3.7 3.6 3.8  Chloride 98 - 111 mmol/L 103 100 104  CO2 22 - 32 mmol/L _1 Calcium 8.9 - 10.3 mg/dL 9.8 9.8 9.4  Total Protein 6.5 - 8.1 g/dL 8.0 - 7.7  Total Bilirubin 0.3 - 1.2 mg/dL 0.8 - 0.7  Alkaline Phos 38 - 126 U/L 57 - 57  AST 15 - 41 U/L 22 - 17  ALT 0 - 44 U/L 22 - 18   No results found for: [id number] Lab Results  Component Value Date   WBC 5.9 08/11/2021   HGB 14.6 08/11/2021   HCT 43.4 08/11/2021   MCV 92.1 08/11/2021   PLT 314 08/11/2021   NEUTROABS 3.3 08/11/2021    ASSESSMENT:  1.  Stage II (PT2PN89m) left breast invasive lobular carcinoma: -Left breast biopsy on 02/13/2020 shows multifocal invasive lobular carcinoma of 12:30 position and 2:30-3:00, ER 90% positive, PR 100% positive, Ki-67 5%, HER-2 negative. -Left mastectomy on 03/25/2020 with 4.5 cm multifocal invasive lobular carcinoma, grade 2, LCIS, 1 lymph node with micrometastasis, margins negative. -Anastrozole started on 04/08/2020.   2.  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 -Sister has breast cancer 9 years ago.  Maternal uncle had cancer unknown to the patient.   PLAN:  1.  Stage II left breast invasive lobular carcinoma: - Left mastectomy site is within normal limits.  Right breast has no palpable masses.  No palpable adenopathy. - She is tolerating anastrozole reasonably well.  No hot flashes. - She does report bilateral wrist pains since anastrozole was started. - Reviewed labs from 08/11/2021 which showed normal LFTs.  CBC was grossly normal. - RTC 6 to 7 months after mammogram and labs.   2.  Bone health: - Last DEXA scan on 04/18/2020 with T score -2.2. - Vitamin D level is 55. - Continue calcium and vitamin D. - Repeat DEXA scan prior to next visit.  Breast Cancer therapy associated bone loss: I have recommended calcium, Vitamin D and weight bearing  exercises.  Orders placed this encounter:  No orders of the defined types were placed in this encounter.   The patient has a good understanding of the overall plan. She agrees with it. She will call with any problems that may develop before the next visit here.  SDerek Jack MD [phone]  I, KThana Ates am acting as a scribe for Dr. SDerek Jack  I, SDerek JackMD, have reviewed the above documentation for accuracy and completeness, and I agree with the above.

## 2021-08-18 ENCOUNTER — Other Ambulatory Visit: Payer: Self-pay

## 2021-08-18 ENCOUNTER — Inpatient Hospital Stay (HOSPITAL_COMMUNITY): Payer: Medicare PPO | Admitting: Hematology

## 2021-08-18 ENCOUNTER — Other Ambulatory Visit (HOSPITAL_COMMUNITY): Payer: Self-pay

## 2021-08-18 VITALS — BP 163/80 | HR 73 | Temp 98.3°F | Resp 18 | Wt 185.1 lb

## 2021-08-18 DIAGNOSIS — C50212 Malignant neoplasm of upper-inner quadrant of left female breast: Secondary | ICD-10-CM | POA: Diagnosis not present

## 2021-08-18 DIAGNOSIS — M25531 Pain in right wrist: Secondary | ICD-10-CM | POA: Diagnosis not present

## 2021-08-18 DIAGNOSIS — E559 Vitamin D deficiency, unspecified: Secondary | ICD-10-CM

## 2021-08-18 DIAGNOSIS — C50912 Malignant neoplasm of unspecified site of left female breast: Secondary | ICD-10-CM

## 2021-08-18 DIAGNOSIS — Z803 Family history of malignant neoplasm of breast: Secondary | ICD-10-CM | POA: Diagnosis not present

## 2021-08-18 DIAGNOSIS — M25532 Pain in left wrist: Secondary | ICD-10-CM | POA: Diagnosis not present

## 2021-08-18 DIAGNOSIS — Z170 Estrogen receptor positive status [ER+]: Secondary | ICD-10-CM | POA: Diagnosis not present

## 2021-08-18 DIAGNOSIS — Z79811 Long term (current) use of aromatase inhibitors: Secondary | ICD-10-CM | POA: Diagnosis not present

## 2021-08-18 MED ORDER — ANASTROZOLE 1 MG PO TABS: 1.0000 mg | ORAL_TABLET | Freq: Every day | ORAL | 3 refills | Status: DC

## 2021-08-18 NOTE — Patient Instructions (Addendum)
[place] at [facility] Discharge Instructions   You were seen and examined today by Dr. Delton Coombes. We will send a refill for your anastrozole. We will schedule you for a mammogram and bone scan. Return as scheduled in 6 months.    Thank you for choosing [place] at [facility] to provide your oncology and hematology care.  To afford each patient quality time with our provider, please arrive at least 15 minutes before your scheduled appointment time.   If you have a lab appointment with the [facility] please come in thru the Main Entrance and check in at the main information desk.  You need to re-schedule your appointment should you arrive 10 or more minutes late.  We strive to give you quality time with our providers, and arriving late affects you and other patients whose appointments are after yours.  Also, if you no show three or more times for appointments you may be dismissed from the clinic at the providers discretion.     Again, thank you for choosing [facility].  Our hope is that these requests will decrease the amount of time that you wait before being seen by our physicians.       _____________________________________________________________  Should you have questions after your visit to [facility], please contact our office at [phone] and follow the prompts.  Our office hours are 8:00 a.m. and 4:30 p.m. Monday - Friday.  Please note that voicemails left after 4:00 p.m. may not be returned until the following business day.  We are closed weekends and major holidays.  You do have access to a nurse 24-7, just call the main number to the clinic [phone] and do not press any options, hold on the line and a nurse will answer the phone.    For prescription refill requests, have your pharmacy contact our office and allow 72 hours.    Due to Covid, you will need to wear a mask upon entering the  hospital. If you do not have a mask, a mask will be given to you at the Main Entrance upon arrival. For doctor visits, patients may have 1 support person age 89 or older with them. For treatment visits, patients can not have anyone with them due to social distancing guidelines and our immunocompromised population.

## 2021-08-19 DIAGNOSIS — I10 Essential (primary) hypertension: Secondary | ICD-10-CM | POA: Diagnosis not present

## 2021-08-19 DIAGNOSIS — Z23 Encounter for immunization: Secondary | ICD-10-CM | POA: Diagnosis not present

## 2021-08-19 DIAGNOSIS — Z1389 Encounter for screening for other disorder: Secondary | ICD-10-CM | POA: Diagnosis not present

## 2021-08-19 DIAGNOSIS — Z1331 Encounter for screening for depression: Secondary | ICD-10-CM | POA: Diagnosis not present

## 2021-08-19 DIAGNOSIS — Z0001 Encounter for general adult medical examination with abnormal findings: Secondary | ICD-10-CM | POA: Diagnosis not present

## 2021-08-19 DIAGNOSIS — K219 Gastro-esophageal reflux disease without esophagitis: Secondary | ICD-10-CM | POA: Diagnosis not present

## 2021-08-19 DIAGNOSIS — N6320 Unspecified lump in the left breast, unspecified quadrant: Secondary | ICD-10-CM | POA: Diagnosis not present

## 2021-09-19 DIAGNOSIS — K219 Gastro-esophageal reflux disease without esophagitis: Secondary | ICD-10-CM | POA: Diagnosis not present

## 2021-09-19 DIAGNOSIS — I10 Essential (primary) hypertension: Secondary | ICD-10-CM | POA: Diagnosis not present

## 2021-10-20 DIAGNOSIS — I10 Essential (primary) hypertension: Secondary | ICD-10-CM | POA: Diagnosis not present

## 2021-10-20 DIAGNOSIS — C50912 Malignant neoplasm of unspecified site of left female breast: Secondary | ICD-10-CM | POA: Diagnosis not present

## 2021-11-14 ENCOUNTER — Ambulatory Visit: Payer: Medicare PPO | Admitting: Cardiology

## 2021-11-17 DIAGNOSIS — I10 Essential (primary) hypertension: Secondary | ICD-10-CM | POA: Diagnosis not present

## 2021-11-17 DIAGNOSIS — K219 Gastro-esophageal reflux disease without esophagitis: Secondary | ICD-10-CM | POA: Diagnosis not present

## 2021-12-18 DIAGNOSIS — I10 Essential (primary) hypertension: Secondary | ICD-10-CM | POA: Diagnosis not present

## 2021-12-18 DIAGNOSIS — C50912 Malignant neoplasm of unspecified site of left female breast: Secondary | ICD-10-CM | POA: Diagnosis not present

## 2022-01-11 ENCOUNTER — Encounter: Payer: Self-pay | Admitting: Physician Assistant

## 2022-01-11 NOTE — Progress Notes (Signed)
? ?Cardiology Office Note   ? ?Date:  01/12/2022  ? ?ID:  Susan Beck, DOB [birth year], MRN [id number] ? ?PCP:  Carrolyn Meiers, MD  ?Cardiologist:  Carlyle Dolly, MD  ?Electrophysiologist:  None  ? ?Chief Complaint: f/u HTN ? ?History of Present Illness:  ? ?Susan Beck is a 88 y.o. adult with history of HTN, breast CA s/p surgery/radiation, GERD, diastolic dysfunction who is seen for follow-up. She previously established with cardiology for shortness of breath. Echo 07/2016 showed mild LVH, EF 60-65%, G1DD, mild LAE, aortic sclerosis without stenosis. Nuclear stress test 2017 was low risk without ischemia, hypertensive response to exercise. At last OV 03/2021, chlorthalidone was changed to HCTZ for improved BP control. ? ?She is seen for follow-up today overall feeling stable. She notes chronic fatigue but states this is not new for her. No CP, new dyspnea, dizziness, pre-syncope, syncope or edema. In her home BP log she still frequently has readings hovering around 500-938 systolic. She was 158/66 on arrival here, recheck by me at end of visit 140/65. She notes this is not unusual for her BP to go up with activity. Regarding family history, her father had some sort of leg blockage issue and mother passed peacefully at age 76. ? ? ?Labwork independently reviewed: ?07/2021 CBC wnl, K 3.7, Cr 0.94, AST/ALT ok ?05/2021 Mg 2.0 ?2020 TSH wnl ? ?Cardiology Studies:  ? ?Studies reviewed are outlined and summarized above. Reports included below if pertinent.  ? ?Echo 07/2015 ?- Left ventricle: The cavity size was normal. Wall thickness was  ?  increased in a pattern of mild LVH. Systolic function was normal.  ?  The estimated ejection fraction was in the range of 60% to 65%.  ?  Wall motion was normal; there were no regional wall motion  ?  abnormalities. Doppler parameters are consistent with abnormal  ?  left ventricular relaxation (grade 1 diastolic dysfunction).  ?  Indeterminate filling pressures.  ?-  Aortic valve: Aortic valve sclerosis without stenosis. Mildly  ?  thickened, mildly calcified leaflets.  ?- Left atrium: The atrium was mildly dilated. Volume/bsa, ES,  ?  (1-plane Simpson&'s, A2C): 30 ml/m^2.  ? ?Nuc 2017 ?Blood pressure demonstrated a hypertensive response to exercise. ?There was no ST segment deviation noted during stress. ?The study is normal. There are no perfusion defects. ?This is a low risk study. ?The left ventricular ejection fraction is hyperdynamic (>65%). ?   ? ? ?Past Medical History:  ?Diagnosis Date  ? Bowel obstruction ([place])   ? Essential (primary) hypertension   ? Gastro-esophageal reflux disease with esophagitis   ? Nontoxic goiter, unspecified   ? Other fatigue   ? Shortness of breath   ? ? ?Past Surgical History:  ?Procedure Laterality Date  ? APPENDECTOMY    ? CATARACT EXTRACTION, BILATERAL    ? HERNIA REPAIR    ? MASTECTOMY MODIFIED RADICAL Left 03/25/2020  ? Procedure: MASTECTOMY MODIFIED RADICAL;  Surgeon: Aviva Signs, MD;  Location: [facility];  Service: General;  Laterality: Left;  ? SMALL INTESTINE SURGERY    ? THYROIDECTOMY    ? ? ?Current Medications: ?Current Meds  ?Medication Sig  ? amLODipine (NORVASC) 5 MG tablet TAKE ONE TABLET BY MOUTH ONCE DAILY. (Patient taking differently: Take 5 mg by mouth at bedtime.)  ? anastrozole (ARIMIDEX) 1 MG tablet Take 1 tablet (1 mg total) by mouth daily.  ? Ascorbic Acid (VITAMIN C) 1000 MG tablet Take 1,000 mg by mouth daily.  ?  Calcium Carbonate-Vitamin D (CALTRATE 600+D PO) Take by mouth daily.  ? chlorthalidone (HYGROTON) 25 MG tablet Take 0.5 tablets (12.5 mg total) by mouth daily.  ? Multiple Vitamin (MULTIVITAMIN WITH MINERALS) TABS tablet Take 1 tablet by mouth daily. Centrum Silver  ? omeprazole (PRILOSEC) 20 MG capsule Take 20 mg by mouth at bedtime.   ? ?  ? ?Allergies:   Patient has no known allergies.  ? ?Social History  ? ?Socioeconomic History  ? Marital status: Divorced  ?  Spouse name: Not on file  ? Number of  children: Not on file  ? Years of education: Not on file  ? Highest education level: Not on file  ?Occupational History  ? Occupation: retired  ?Tobacco Use  ? Smoking status: Former  ?  Packs/day: 1.00  ?  Types: Cigarettes  ?  Start date: 07/15/1954  ?  Quit date: 07/16/1999  ?  Years since quitting: 22.5  ? Smokeless tobacco: Never  ?Vaping Use  ? Vaping Use: Never used  ?Substance and Sexual Activity  ? Alcohol use: Not Currently  ?  Alcohol/week: 0.0 standard drinks  ? Drug use: Not Currently  ? Sexual activity: Not Currently  ?Other Topics Concern  ? Not on file  ?Social History Narrative  ? Not on file  ? ?Social Determinants of Health  ? ?Financial Resource Strain: Not on file  ?Food Insecurity: Not on file  ?Transportation Needs: Not on file  ?Physical Activity: Not on file  ?Stress: Not on file  ?Social Connections: Not on file  ?  ? ?Family History:  ?The patient's family history includes Breast cancer in his sister; Heart disease in his paternal grandmother. ? ?ROS:   ?Please see the history of present illness.  ?All other systems are reviewed and otherwise negative.  ? ? ?EKG(s)/Additional Labs  ? ?EKG:  EKG is ordered today, personally reviewed, demonstrating NSR 63bpm with sinus arrhythmia, prior anteroseptal infarct based on septal Q's, nonspecific TW changes III - stable form prior. ? ?Recent Labs: ?05/12/2021: Magnesium 2.0 ?08/11/2021: ALT 22; BUN 13; Creatinine, Ser 0.94; Hemoglobin 14.6; Platelets 314; Potassium 3.7; Sodium 138  ?Recent Lipid Panel ?   ?Component Value Date/Time  ? CHOL 190 01/17/2019 0000  ? TRIG 94 01/17/2019 0000  ? HDL 46 01/17/2019 0000  ? Harris 105 01/17/2019 0000  ? ? ?PHYSICAL EXAM:   ? ?VS:  BP (!) 158/66   Pulse 63   Ht '5\' 5"'$  (1.651 m)   Wt 184 lb (83.5 kg)   SpO2 97%   BMI 30.62 kg/m?   BMI: Body mass index is 30.62 kg/m?. ? ?GEN: Well nourished, well developed adult in no acute distress ?HEENT: normocephalic, atraumatic ?Neck: no JVD, carotid bruits, or  masses ?Cardiac: RRR; no murmurs, rubs, or gallops, no edema  ?Respiratory:  clear to auscultation bilaterally, normal work of breathing ?GI: soft, nontender, nondistended, + BS ?MS: no deformity or atrophy ?Skin: warm and dry, no rash ?Neuro:  Alert and Oriented x 3, Strength and sensation are intact, follows commands ?Psych: euthymic mood, full affect ? ?Wt Readings from Last 3 Encounters:  ?01/12/22 184 lb (83.5 kg)  ?08/18/21 185 lb 1.6 oz (84 kg)  ?04/24/21 181 lb (82.1 kg)  ?  ? ?ASSESSMENT & PLAN:  ? ?1. Essential HTN - BP suboptimally controlled but only marginally so. She takes her amlodipine and chlorthalidone at night. I would like to see how she does taking these in the AM so that she is  getting maximum effect during daytime hours. I asked her to call us with BP readings in 1-2 weeks. I am very impressed with how diligently she is keeping records. We also discussed low sodium diet. I'll update her BMET, TSH today in the event we need to make further med changes. Would consider either increasing chlorthalidone or adding ARB as next step to regimen. She did not previously tolerate higher doses of amlodipine due to leg swelling. This is not present on exam on the '5mg'$  dose.  ? ?2. Shortness of breath - stable without any new changes. She is trying to remain active where she can. ? ?3. Left ventricular hypertrophy with diastolic dysfunction - goal SBP <130/80 as tolerated. No symptoms of heart failure on examination today, but continue to work towards blood pressure goal. ? ?4. Fatigue - longstanding for patient. Will update CBC, TSH, BMET today with labs. If unrevealing recommend f/u PCP. She also isn't sure whether her PCP is checking her cholesterol but she plans to ask them. I told her to let us know if they aren't and we can arrange for fasting bloodwork with our office (FLP/LFTs). ? ?  ? ?Disposition: F/u with me in 2 months to f/u BP. ? ? ?Medication Adjustments/Labs and Tests Ordered: ?Current  medicines are reviewed at length with the patient today.  Concerns regarding medicines are outlined above. Medication changes, Labs and Tests ordered today are summarized above and listed in the Patient Instructions accessibl

## 2022-01-12 ENCOUNTER — Other Ambulatory Visit (HOSPITAL_COMMUNITY)
Admission: RE | Admit: 2022-01-12 | Discharge: 2022-01-12 | Disposition: A | Discharge status: Home / Self Care | Payer: Medicare PPO | Source: Ambulatory Visit | Attending: Physician Assistant | Admitting: Physician Assistant

## 2022-01-12 ENCOUNTER — Ambulatory Visit: Payer: Medicare PPO | Admitting: Physician Assistant

## 2022-01-12 ENCOUNTER — Encounter: Payer: Self-pay | Admitting: Physician Assistant

## 2022-01-12 VITALS — BP 140/65 | HR 63 | Ht 65.0 in | Wt 184.0 lb

## 2022-01-12 DIAGNOSIS — R0602 Shortness of breath: Secondary | ICD-10-CM | POA: Diagnosis not present

## 2022-01-12 DIAGNOSIS — I10 Essential (primary) hypertension: Secondary | ICD-10-CM

## 2022-01-12 DIAGNOSIS — I5189 Other ill-defined heart diseases: Secondary | ICD-10-CM

## 2022-01-12 DIAGNOSIS — R5383 Other fatigue: Secondary | ICD-10-CM

## 2022-01-12 DIAGNOSIS — I517 Cardiomegaly: Secondary | ICD-10-CM

## 2022-01-12 LAB — BASIC METABOLIC PANEL
Anion gap: 5 (ref 5–15)
BUN: 13 mg/dL (ref 8–23)
CO2: 29 mmol/L (ref 22–32)
Calcium: 9.8 mg/dL (ref 8.9–10.3)
Chloride: 105 mmol/L (ref 98–111)
Creatinine, Ser: 0.98 mg/dL (ref 0.44–1.00)
GFR, Estimated: 56 mL/min — ABNORMAL LOW (ref >60)
Glucose, Bld: 117 mg/dL — ABNORMAL HIGH (ref 70–99)
Potassium: 3.7 mmol/L (ref 3.5–5.1)
Sodium: 139 mmol/L (ref 135–145)

## 2022-01-12 LAB — CBC
HCT: 41.6 % (ref 36.0–46.0)
Hemoglobin: 14 g/dL (ref 12.0–15.0)
MCH: 30 pg (ref 26.0–34.0)
MCHC: 33.7 g/dL (ref 30.0–36.0)
MCV: 89.1 fL (ref 80.0–100.0)
Platelets: 296 10*3/uL (ref 150–400)
RBC: 4.67 MIL/uL (ref 3.87–5.11)
RDW: 14.6 % (ref 11.5–15.5)
WBC: 6.1 10*3/uL (ref 4.0–10.5)
nRBC: 0 % (ref 0.0–0.2)

## 2022-01-12 LAB — TSH: TSH: 1.494 u[IU]/mL (ref 0.350–4.500)

## 2022-01-12 NOTE — Patient Instructions (Signed)
Medication Instructions:  ? ?Please the way you take your Amlodipine and Chlorthalidone. Take in the morning and record blood pressures for 1-2 weeks and report to our office.  ? ?*If you need a refill on your cardiac medications before your next appointment, please call your pharmacy* ? ? ?Lab Work: ?Your physician recommends that you return for lab work in: Today ( BMET, CBC, TSH)  ? ?If you have labs (blood work) drawn today and your tests are completely normal, you will receive your results only by: ?MyChart Message (if you have MyChart) OR ?A paper copy in the mail ?If you have any lab test that is abnormal or we need to change your treatment, we will call you to review the results. ? ? ?Testing/Procedures: ?NONE  ? ? ?Follow-Up: ?At [facility], you and your health needs are our priority.  As part of our continuing mission to provide you with exceptional heart care, we have created designated Provider Care Teams.  These Care Teams include your primary Cardiologist (physician) and Advanced Practice Providers (APPs -  Physician Assistants and Nurse Practitioners) who all work together to provide you with the care you need, when you need it. ? ?We recommend signing up for the patient portal called "MyChart".  Sign up information is provided on this After Visit Summary.  MyChart is used to connect with patients for Virtual Visits (Telemedicine).  Patients are able to view lab/test results, encounter notes, upcoming appointments, etc.  Non-urgent messages can be sent to your provider as well.   ?To learn more about what you can do with MyChart, go to [email or web address].   ? ?Your next appointment:   ?2 month(s) ? ?The format for your next appointment:   ?In Person ? ?Provider:   ?Melina Copa, PA-C    ? ? ?Other Instructions ?Thank you for choosing [facility]! ? ? ? ?Important Information About [employer] ? ? ? ? ? ? ?

## 2022-01-17 DIAGNOSIS — I10 Essential (primary) hypertension: Secondary | ICD-10-CM | POA: Diagnosis not present

## 2022-01-17 DIAGNOSIS — C50912 Malignant neoplasm of unspecified site of left female breast: Secondary | ICD-10-CM | POA: Diagnosis not present

## 2022-01-28 ENCOUNTER — Telehealth: Payer: Self-pay | Admitting: Physician Assistant

## 2022-01-28 DIAGNOSIS — I10 Essential (primary) hypertension: Secondary | ICD-10-CM

## 2022-01-28 MED ORDER — POTASSIUM CHLORIDE CRYS ER 20 MEQ PO TBCR: 40.0000 meq | EXTENDED_RELEASE_TABLET | Freq: Every day | ORAL | 3 refills | Status: DC

## 2022-01-28 MED ORDER — CHLORTHALIDONE 25 MG PO TABS: 25.0000 mg | ORAL_TABLET | Freq: Every day | ORAL | 3 refills | Status: DC

## 2022-01-28 NOTE — Telephone Encounter (Signed)
Pt notified and voiced understanding. Orders placed.

## 2022-01-28 NOTE — Telephone Encounter (Signed)
   Received the following document from front desk [facility] in secure email:     Though BP appears slightly improved transitioning to the day time, she is not quite at goal yet and still has intermittent spikes. She had worsening swelling on higher doses of amlodipine so would not increase that - keep at same dose. Let's instead increase chlorthalidone from 12.'5mg'$  to '25mg'$  once daily with addition of KCl 10mq once daily with BMET 1 week after making this change. Submit BP readings in 1-2 weeks for review but notify for any adverse effects in the interim. Otherwise keep f/u as planned with me.  Radhika Dershem PA-C

## 2022-02-05 DIAGNOSIS — I10 Essential (primary) hypertension: Secondary | ICD-10-CM | POA: Diagnosis not present

## 2022-02-05 DIAGNOSIS — R600 Localized edema: Secondary | ICD-10-CM | POA: Diagnosis not present

## 2022-02-05 DIAGNOSIS — K219 Gastro-esophageal reflux disease without esophagitis: Secondary | ICD-10-CM | POA: Diagnosis not present

## 2022-02-20 ENCOUNTER — Encounter: Payer: Self-pay | Admitting: Physician Assistant

## 2022-02-20 ENCOUNTER — Telehealth: Payer: Self-pay | Admitting: *Deleted

## 2022-02-23 ENCOUNTER — Other Ambulatory Visit (HOSPITAL_COMMUNITY)
Admission: RE | Admit: 2022-02-23 | Discharge: 2022-02-23 | Disposition: A | Discharge status: Home / Self Care | Payer: Medicare PPO | Attending: Physician Assistant | Admitting: Physician Assistant

## 2022-02-23 DIAGNOSIS — I10 Essential (primary) hypertension: Secondary | ICD-10-CM | POA: Insufficient documentation

## 2022-02-23 LAB — BASIC METABOLIC PANEL
Anion gap: 8 (ref 5–15)
BUN: 14 mg/dL (ref 8–23)
CO2: 28 mmol/L (ref 22–32)
Calcium: 9.9 mg/dL (ref 8.9–10.3)
Chloride: 101 mmol/L (ref 98–111)
Creatinine, Ser: 0.96 mg/dL (ref 0.44–1.00)
GFR, Estimated: 57 mL/min — ABNORMAL LOW (ref >60)
Glucose, Bld: 107 mg/dL — ABNORMAL HIGH (ref 70–99)
Potassium: 3.9 mmol/L (ref 3.5–5.1)
Sodium: 137 mmol/L (ref 135–145)

## 2022-02-24 ENCOUNTER — Telehealth: Payer: Self-pay

## 2022-02-24 MED ORDER — IRBESARTAN 150 MG PO TABS: 150.0000 mg | ORAL_TABLET | Freq: Every day | ORAL | 3 refills | Status: DC

## 2022-03-04 NOTE — Progress Notes (Unsigned)
Cardiology Office Note    Date:  03/05/2022   ID:  Susan Beck, DOB [birth year], MRN [id number]  PCP:  Carrolyn Meiers, MD  Cardiologist:  Carlyle Dolly, MD  Electrophysiologist:  None   Chief Complaint: f/u HTN  History of Present Illness:   Susan Beck is a 88 y.o. adult with history of HTN, breast CA s/p surgery/radiation, GERD, diastolic dysfunction who is seen for follow-up. She previously established with cardiology for shortness of breath. Echo 07/2016 showed mild LVH, EF 60-65%, G1DD, mild LAE, aortic sclerosis without stenosis. Nuclear stress test 2017 was low risk without ischemia, hypertensive response to exercise. I saw her in 12/2021 at which time we pursued improved BP control with titration of chlorthalidone then addition of irbesartan by subsequent phone notes.  She returns for follow-up overall doing well. She reports generalized fatigue, unchanged from prior. She generally does not sleep well at night. No new CP or SOB. She does have occasional leg cramps and is thinking about taking magnesium glycinate (has normal creatinine). Brings in log of BPs with last several days worth of readings 130/62, 129/61, 120/61, 132/68, 131/63, 142/68, 131/66. She does report knowing that her BP spikes with anxiety. Here in the office it was 140/56 with recheck by me 160/62.   Labwork independently reviewed: 01/2022 K 3.9, Cr 0.96 12/2021 TSH and CBC wnl 07/2021 CBC wnl, K 3.7, Cr 0.94, AST/ALT ok 05/2021 Mg 2.0 2020 TSH wnl   Cardiology Studies:   Studies reviewed are outlined and summarized above. Reports included below if pertinent.   Echo 07/2015 - Left ventricle: The cavity size was normal. Wall thickness was    increased in a pattern of mild LVH. Systolic function was normal.    The estimated ejection fraction was in the range of 60% to 65%.    Wall motion was normal; there were no regional wall motion    abnormalities. Doppler parameters are consistent with  abnormal    left ventricular relaxation (grade 1 diastolic dysfunction).    Indeterminate filling pressures.  - Aortic valve: Aortic valve sclerosis without stenosis. Mildly    thickened, mildly calcified leaflets.  - Left atrium: The atrium was mildly dilated. Volume/bsa, ES,    (1-plane Simpson&'s, A2C): 30 ml/m^2.    Nuc 2017 Blood pressure demonstrated a hypertensive response to exercise. There was no ST segment deviation noted during stress. The study is normal. There are no perfusion defects. This is a low risk study. The left ventricular ejection fraction is hyperdynamic (>65%).    Past Medical History:  Diagnosis Date   Bowel obstruction ([place])    Essential (primary) hypertension    Gastro-esophageal reflux disease with esophagitis    Nontoxic goiter, unspecified    Other fatigue    Shortness of breath     Past Surgical History:  Procedure Laterality Date   APPENDECTOMY     CATARACT EXTRACTION, BILATERAL     HERNIA REPAIR     MASTECTOMY MODIFIED RADICAL Left 03/25/2020   Procedure: MASTECTOMY MODIFIED RADICAL;  Surgeon: Aviva Signs, MD;  Location: [facility];  Service: General;  Laterality: Left;   SMALL INTESTINE SURGERY     THYROIDECTOMY      Current Medications: Current Meds  Medication Sig   amLODipine (NORVASC) 5 MG tablet TAKE ONE TABLET BY MOUTH ONCE DAILY. (Patient taking differently: Take 5 mg by mouth daily.)   anastrozole (ARIMIDEX) 1 MG tablet Take 1 tablet (1 mg total) by mouth daily.   Ascorbic  Acid (VITAMIN C) 1000 MG tablet Take 1,000 mg by mouth daily.   Calcium Carbonate-Vitamin D (CALTRATE 600+D PO) Take by mouth daily.   chlorthalidone (HYGROTON) 25 MG tablet Take 1 tablet (25 mg total) by mouth daily.   irbesartan (AVAPRO) 150 MG tablet Take 1 tablet (150 mg total) by mouth daily.   Multiple Vitamin (MULTIVITAMIN WITH MINERALS) TABS tablet Take 1 tablet by mouth daily. Centrum Silver   omeprazole (PRILOSEC) 20 MG capsule Take 20 mg by mouth at  bedtime.    potassium chloride SA (KLOR-CON M) 20 MEQ tablet Take 2 tablets (40 mEq total) by mouth daily.      Allergies:   Patient has no known allergies.   Social History   Socioeconomic History   Marital status: Divorced    Spouse name: Not on file   Number of children: Not on file   Years of education: Not on file   Highest education level: Not on file  Occupational History   Occupation: retired  Tobacco Use   Smoking status: Former    Packs/day: 1.00    Types: Cigarettes    Start date: 07/15/1954    Quit date: 07/16/1999    Years since quitting: 22.6   Smokeless tobacco: Never  Vaping Use   Vaping Use: Never used  Substance and Sexual Activity   Alcohol use: Not Currently    Alcohol/week: 0.0 standard drinks of alcohol   Drug use: Not Currently   Sexual activity: Not Currently  Other Topics Concern   Not on file  Social History Narrative   Not on file   Social Determinants of Health   Financial Resource Strain: Low Risk  (04/05/2020)   Overall Financial Resource Strain (CARDIA)    Difficulty of Paying Living Expenses: Not hard at all  Food Insecurity: No Food Insecurity (04/05/2020)   Hunger Vital Sign    Worried About Running Out of Food in the Last Year: Never true    [place] in the Last Year: Never true  Transportation Needs: No Transportation Needs (04/05/2020)   PRAPARE - Hydrologist (Medical): No    Lack of Transportation (Non-Medical): No  Physical Activity: Insufficiently Active (04/05/2020)   Exercise Vital Sign    Days of Exercise per Week: 2 days    Minutes of Exercise per Session: 20 min  Stress: No Stress Concern Present (04/05/2020)   [place]    Feeling of Stress : Only a little  Social Connections: Moderately Isolated (04/05/2020)   Social Connection and Isolation Panel [NHANES]    Frequency of Communication with Friends and Family: More than  three times a week    Frequency of Social Gatherings with Friends and Family: More than three times a week    Attends Religious Services: More than 4 times per year    Active Member of [employer] or Organizations: No    Attends Archivist Meetings: Never    Marital Status: Divorced     Family History:  The patient's family history includes Breast cancer in his sister; Heart disease in his paternal grandmother.  ROS:   Please see the history of present illness.  All other systems are reviewed and otherwise negative.    EKG(s)/Additional Labs   EKG:  EKG is not ordered today  Recent Labs: 05/12/2021: Magnesium 2.0 08/11/2021: ALT 22 01/12/2022: Hemoglobin 14.0; Platelets 296; TSH 1.494 02/23/2022: BUN 14; Creatinine, Ser 0.96; Potassium  3.9; Sodium 137  Recent Lipid Panel    Component Value Date/Time   CHOL 190 01/17/2019 0000   TRIG 94 01/17/2019 0000   HDL 46 01/17/2019 0000   LDLCALC 105 01/17/2019 0000    PHYSICAL EXAM:    VS:  BP (!) 140/56   Pulse 64   Ht '5\' 5"'$  (1.651 m)   Wt 185 lb (83.9 kg)   SpO2 96%   BMI 30.79 kg/m   BMI: Body mass index is 30.79 kg/m.  GEN: Well nourished, well developed adult in no acute distress HEENT: normocephalic, atraumatic Neck: no JVD, carotid bruits, or masses Cardiac: RRR; no murmurs, rubs, or gallops, no edema  Respiratory:  clear to auscultation bilaterally, normal work of breathing GI: soft, nontender, nondistended, + BS MS: no deformity or atrophy Skin: warm and dry, no rash Neuro:  Alert and Oriented x 3, Strength and sensation are intact, follows commands Psych: euthymic mood, full affect  Wt Readings from Last 3 Encounters:  03/05/22 185 lb (83.9 kg)  01/12/22 184 lb (83.5 kg)  08/18/21 185 lb 1.6 oz (84 kg)     ASSESSMENT & PLAN:   1. Essential HTN - has made quite a bit of improvement by home logs with titration of chlorthalidone and addition of irbesartan to her previous regimen of amlodipine. She does  still occasionally experience spikes when anxious. She was spacing out her irbesartan to bedtime so will try moving it to the AM to give her more daytime effect while she's awake and processing her environment. Check BMET/Mg today given leg cramps. Otherwise she'll report BP back in 1 week as she's been doing.  2. Diastolic dysfunction without heart failure - no new concerns with this. We have been working on BP control as above.  This was a focused visit for BP.  Please see visit 01/12/22 for more comprehensive assessment.    Disposition: F/u with myself or Dr. Harl Bowie in 6 months.   Medication Adjustments/Labs and Tests Ordered: Current medicines are reviewed at length with the patient today.  Concerns regarding medicines are outlined above. Medication changes, Labs and Tests ordered today are summarized above and listed in the Patient Instructions accessible in Encounters.    Signed, Charlie Pitter, PA-C  03/05/2022 3:57 PM    [facility] Location in [place], [place] [address] Ph: [phone]; Fax [phone]

## 2022-03-05 ENCOUNTER — Ambulatory Visit: Payer: Medicare PPO | Admitting: Physician Assistant

## 2022-03-05 ENCOUNTER — Encounter: Payer: Self-pay | Admitting: Physician Assistant

## 2022-03-05 VITALS — BP 140/56 | HR 64 | Ht 65.0 in | Wt 185.0 lb

## 2022-03-05 DIAGNOSIS — I5189 Other ill-defined heart diseases: Secondary | ICD-10-CM | POA: Diagnosis not present

## 2022-03-05 DIAGNOSIS — I10 Essential (primary) hypertension: Secondary | ICD-10-CM | POA: Diagnosis not present

## 2022-03-05 NOTE — Patient Instructions (Signed)
Medication Instructions:  Your physician recommends that you continue on your current medications as directed. Please refer to the Current Medication list given to you today.   TAKE YOUR IRBESARTAN IN THE MORNING  Labwork: BMET MAG  Testing/Procedures: None  Follow-Up: Follow up with Dr. Harl Bowie or Melina Copa, PA-C in 6 months.   Any Other Special Instructions Will Be Listed Below (If Applicable).  Keep track of your blood pressures for 1 week and report back to [place].    If you need a refill on your cardiac medications before your next appointment, please call your pharmacy.

## 2022-03-07 DIAGNOSIS — C50912 Malignant neoplasm of unspecified site of left female breast: Secondary | ICD-10-CM | POA: Diagnosis not present

## 2022-03-07 DIAGNOSIS — I10 Essential (primary) hypertension: Secondary | ICD-10-CM | POA: Diagnosis not present

## 2022-03-09 ENCOUNTER — Other Ambulatory Visit (HOSPITAL_COMMUNITY)
Admission: RE | Admit: 2022-03-09 | Discharge: 2022-03-09 | Disposition: A | Discharge status: Home / Self Care | Payer: Medicare PPO | Attending: Physician Assistant | Admitting: Physician Assistant

## 2022-03-09 DIAGNOSIS — I119 Hypertensive heart disease without heart failure: Secondary | ICD-10-CM | POA: Insufficient documentation

## 2022-03-09 DIAGNOSIS — I5189 Other ill-defined heart diseases: Secondary | ICD-10-CM

## 2022-03-09 DIAGNOSIS — I10 Essential (primary) hypertension: Secondary | ICD-10-CM

## 2022-03-09 LAB — BASIC METABOLIC PANEL
Anion gap: 6 (ref 5–15)
BUN: 15 mg/dL (ref 8–23)
CO2: 28 mmol/L (ref 22–32)
Calcium: 9.7 mg/dL (ref 8.9–10.3)
Chloride: 104 mmol/L (ref 98–111)
Creatinine, Ser: 0.94 mg/dL (ref 0.44–1.00)
GFR, Estimated: 59 mL/min — ABNORMAL LOW (ref >60)
Glucose, Bld: 134 mg/dL — ABNORMAL HIGH (ref 70–99)
Potassium: 3.8 mmol/L (ref 3.5–5.1)
Sodium: 138 mmol/L (ref 135–145)

## 2022-03-09 LAB — MAGNESIUM: Magnesium: 2 mg/dL (ref 1.7–2.4)

## 2022-03-16 ENCOUNTER — Telehealth: Payer: Self-pay | Admitting: Physician Assistant

## 2022-03-16 NOTE — Telephone Encounter (Signed)
Patient notified via My Chart

## 2022-03-16 NOTE — Telephone Encounter (Signed)
Please let pt know scanned BP logs look satisfactory - Continue current regimen and continue plan as discussed at recent OV

## 2022-04-09 DIAGNOSIS — I10 Essential (primary) hypertension: Secondary | ICD-10-CM | POA: Diagnosis not present

## 2022-04-09 DIAGNOSIS — C50912 Malignant neoplasm of unspecified site of left female breast: Secondary | ICD-10-CM | POA: Diagnosis not present

## 2022-04-20 ENCOUNTER — Other Ambulatory Visit (HOSPITAL_COMMUNITY): Payer: Medicare PPO

## 2022-04-20 ENCOUNTER — Inpatient Hospital Stay: Payer: Medicare PPO | Attending: Hematology

## 2022-04-20 ENCOUNTER — Ambulatory Visit (HOSPITAL_COMMUNITY): Payer: Medicare PPO

## 2022-04-20 DIAGNOSIS — Z853 Personal history of malignant neoplasm of breast: Secondary | ICD-10-CM | POA: Diagnosis not present

## 2022-04-20 DIAGNOSIS — Z79811 Long term (current) use of aromatase inhibitors: Secondary | ICD-10-CM | POA: Insufficient documentation

## 2022-04-20 DIAGNOSIS — E559 Vitamin D deficiency, unspecified: Secondary | ICD-10-CM

## 2022-04-20 DIAGNOSIS — Z803 Family history of malignant neoplasm of breast: Secondary | ICD-10-CM | POA: Insufficient documentation

## 2022-04-20 DIAGNOSIS — C50912 Malignant neoplasm of unspecified site of left female breast: Secondary | ICD-10-CM

## 2022-04-20 LAB — COMPREHENSIVE METABOLIC PANEL
ALT: 24 U/L (ref 0–44)
AST: 21 U/L (ref 15–41)
Albumin: 4 g/dL (ref 3.5–5.0)
Alkaline Phosphatase: 65 U/L (ref 38–126)
Anion gap: 8 (ref 5–15)
BUN: 17 mg/dL (ref 8–23)
CO2: 25 mmol/L (ref 22–32)
Calcium: 10.1 mg/dL (ref 8.9–10.3)
Chloride: 104 mmol/L (ref 98–111)
Creatinine, Ser: 1.06 mg/dL — ABNORMAL HIGH (ref 0.44–1.00)
GFR, Estimated: 51 mL/min — ABNORMAL LOW (ref >60)
Glucose, Bld: 121 mg/dL — ABNORMAL HIGH (ref 70–99)
Potassium: 3.8 mmol/L (ref 3.5–5.1)
Sodium: 137 mmol/L (ref 135–145)
Total Bilirubin: 0.5 mg/dL (ref 0.3–1.2)
Total Protein: 8.2 g/dL — ABNORMAL HIGH (ref 6.5–8.1)

## 2022-04-20 LAB — CBC WITH DIFFERENTIAL/PLATELET
Abs Immature Granulocytes: 0.03 10*3/uL (ref 0.00–0.07)
Basophils Absolute: 0.1 10*3/uL (ref 0.0–0.1)
Basophils Relative: 1 %
Eosinophils Absolute: 0 10*3/uL (ref 0.0–0.5)
Eosinophils Relative: 0 %
HCT: 42.6 % (ref 36.0–46.0)
Hemoglobin: 14.3 g/dL (ref 12.0–15.0)
Immature Granulocytes: 0 %
Lymphocytes Relative: 28 %
Lymphs Abs: 1.9 10*3/uL (ref 0.7–4.0)
MCH: 29.8 pg (ref 26.0–34.0)
MCHC: 33.6 g/dL (ref 30.0–36.0)
MCV: 88.8 fL (ref 80.0–100.0)
Monocytes Absolute: 0.7 10*3/uL (ref 0.1–1.0)
Monocytes Relative: 10 %
Neutro Abs: 4.3 10*3/uL (ref 1.7–7.7)
Neutrophils Relative %: 61 %
Platelets: 308 10*3/uL (ref 150–400)
RBC: 4.8 MIL/uL (ref 3.87–5.11)
RDW: 14.6 % (ref 11.5–15.5)
WBC: 7 10*3/uL (ref 4.0–10.5)
nRBC: 0 % (ref 0.0–0.2)

## 2022-04-20 LAB — VITAMIN D 25 HYDROXY (VIT D DEFICIENCY, FRACTURES): Vit D, 25-Hydroxy: 41.93 ng/mL (ref 30–100)

## 2022-04-23 ENCOUNTER — Ambulatory Visit (HOSPITAL_COMMUNITY)
Admission: RE | Admit: 2022-04-23 | Discharge: 2022-04-23 | Disposition: A | Discharge status: Home / Self Care | Payer: Medicare PPO | Source: Ambulatory Visit | Attending: Hematology | Admitting: Hematology

## 2022-04-23 ENCOUNTER — Other Ambulatory Visit (HOSPITAL_COMMUNITY): Payer: Self-pay | Admitting: Hematology

## 2022-04-23 DIAGNOSIS — C50912 Malignant neoplasm of unspecified site of left female breast: Secondary | ICD-10-CM

## 2022-04-23 DIAGNOSIS — Z1231 Encounter for screening mammogram for malignant neoplasm of breast: Secondary | ICD-10-CM | POA: Diagnosis not present

## 2022-04-23 DIAGNOSIS — M8589 Other specified disorders of bone density and structure, multiple sites: Secondary | ICD-10-CM | POA: Diagnosis not present

## 2022-04-23 DIAGNOSIS — Z780 Asymptomatic menopausal state: Secondary | ICD-10-CM | POA: Insufficient documentation

## 2022-04-27 ENCOUNTER — Inpatient Hospital Stay: Payer: Medicare PPO | Admitting: Hematology

## 2022-04-27 VITALS — BP 136/67 | HR 71 | Temp 99.0°F | Resp 18 | Ht 65.0 in | Wt 176.6 lb

## 2022-04-27 DIAGNOSIS — Z853 Personal history of malignant neoplasm of breast: Secondary | ICD-10-CM | POA: Diagnosis not present

## 2022-04-27 DIAGNOSIS — Z803 Family history of malignant neoplasm of breast: Secondary | ICD-10-CM | POA: Diagnosis not present

## 2022-04-27 DIAGNOSIS — C50912 Malignant neoplasm of unspecified site of left female breast: Secondary | ICD-10-CM | POA: Diagnosis not present

## 2022-04-27 DIAGNOSIS — Z79811 Long term (current) use of aromatase inhibitors: Secondary | ICD-10-CM | POA: Diagnosis not present

## 2022-04-27 NOTE — Progress Notes (Signed)
[facility] [address]  [address]   Patient Care Team: Carrolyn Meiers, MD as PCP - General (Internal Medicine) Harl Bowie Alphonse Guild, MD as PCP - Cardiology (Cardiology) Brien Mates, RN as Oncology Nurse Navigator (Oncology) Donetta Potts, RN as Oncology Nurse Navigator (Oncology)  SUMMARY OF ONCOLOGIC HISTORY: Oncology History   No history exists.    CHIEF COMPLIANT: Follow-up for left breast cancer   INTERVAL HISTORY: [name] is a 88 y.o. adult seen for follow-up of left breast cancer.  She is tolerating anastrozole reasonably well.  Bilateral wrist pains reported at last visit have completely resolved.  She reports that her younger sister was diagnosed with breast cancer recently.  REVIEW OF SYSTEMS:   Review of Systems  Constitutional:  Negative for appetite change (75%) and fatigue (50%).  Endocrine: Negative for hot flashes.  Genitourinary:  Positive for frequency.   Musculoskeletal:  Negative for arthralgias.  Neurological:  Positive for dizziness.  Psychiatric/Behavioral:  Positive for sleep disturbance.   All other systems reviewed and are negative.   I have reviewed the past medical history, past surgical history, social history and family history with the patient and they are unchanged from previous note.   ALLERGIES:   has No Known Allergies.   MEDICATIONS:  Current Outpatient Medications  Medication Sig Dispense Refill   amLODipine (NORVASC) 5 MG tablet TAKE ONE TABLET BY MOUTH ONCE DAILY. (Patient taking differently: Take 5 mg by mouth daily.) 30 tablet 0   anastrozole (ARIMIDEX) 1 MG tablet Take 1 tablet (1 mg total) by mouth daily. 90 tablet 3   Ascorbic Acid (VITAMIN C) 1000 MG tablet Take 1,000 mg by mouth daily.     Calcium Carbonate-Vitamin D (CALTRATE 600+D PO) Take by mouth daily.     chlorthalidone (HYGROTON) 25 MG tablet Take 1 tablet (25 mg total) by mouth daily. 90 tablet 3   irbesartan  (AVAPRO) 150 MG tablet Take 1 tablet (150 mg total) by mouth daily. 90 tablet 3   Multiple Vitamin (MULTIVITAMIN WITH MINERALS) TABS tablet Take 1 tablet by mouth daily. Centrum Silver     omeprazole (PRILOSEC) 20 MG capsule Take 20 mg by mouth at bedtime.      potassium chloride SA (KLOR-CON M) 20 MEQ tablet Take 2 tablets (40 mEq total) by mouth daily. 180 tablet 3   No current facility-administered medications for this visit.     PHYSICAL EXAMINATION: Performance status (ECOG): 1 - Symptomatic but completely ambulatory  Vitals:   04/27/22 1145  BP: 136/67  Pulse: 71  Resp: 18  Temp: 99 F (37.2 C)  SpO2: 99%   Wt Readings from Last 3 Encounters:  04/27/22 176 lb 9.6 oz (80.1 kg)  03/05/22 185 lb (83.9 kg)  01/12/22 184 lb (83.5 kg)   Physical Exam Vitals reviewed.  Constitutional:      Appearance: Normal appearance.  Cardiovascular:     Rate and Rhythm: Normal rate and regular rhythm.     Pulses: Normal pulses.     Heart sounds: Normal heart sounds.  Pulmonary:     Effort: Pulmonary effort is normal.     Breath sounds: Normal breath sounds.  Chest:  Breasts:    Right: Normal. No swelling, bleeding, mass, skin change or tenderness.     Left: Normal. No swelling, bleeding, mass, skin change (outer quadrant lumpectomy scar within normal limits) or tenderness.  Abdominal:     Palpations: Abdomen is soft. There  is no hepatomegaly, splenomegaly or mass.     Tenderness: There is no abdominal tenderness.  Musculoskeletal:     Right lower leg: No edema.     Left lower leg: No edema.  Lymphadenopathy:     Upper Body:     Right upper body: No supraclavicular, axillary or pectoral adenopathy.     Left upper body: No supraclavicular, axillary or pectoral adenopathy.  Neurological:     General: No focal deficit present.     Mental Status: He is alert and oriented to person, place, and time.  Psychiatric:        Mood and Affect: Mood normal.        Behavior: Behavior  normal.     Breast Exam Chaperone: Thana Ates     LABORATORY DATA:  I have reviewed the data as listed    Latest Ref Rng & Units 04/20/2022    1:26 PM 03/09/2022    8:54 AM 02/23/2022   11:23 AM  CMP  Glucose 70 - 99 mg/dL 121  134  107   BUN 8 - 23 mg/dL _0 Creatinine 0.44 - 1.00 mg/dL 1.06  0.94  0.96   Sodium 135 - 145 mmol/L 137  138  137   Potassium 3.5 - 5.1 mmol/L 3.8  3.8  3.9   Chloride 98 - 111 mmol/L 104  104  101   CO2 22 - 32 mmol/L _1 Calcium 8.9 - 10.3 mg/dL 10.1  9.7  9.9   Total Protein 6.5 - 8.1 g/dL 8.2     Total Bilirubin 0.3 - 1.2 mg/dL 0.5     Alkaline Phos 38 - 126 U/L 65     AST 15 - 41 U/L 21     ALT 0 - 44 U/L 24      No results found for: "CAN153" Lab Results  Component Value Date   WBC 7.0 04/20/2022   HGB 14.3 04/20/2022   HCT 42.6 04/20/2022   MCV 88.8 04/20/2022   PLT 308 04/20/2022   NEUTROABS 4.3 04/20/2022    ASSESSMENT:  1.  Stage II (PT2PN30m) left breast invasive lobular carcinoma: -Left breast biopsy on 02/13/2020 shows multifocal invasive lobular carcinoma of 12:30 position and 2:30-3:00, ER 90% positive, PR 100% positive, Ki-67 5%, HER-2 negative. -Left mastectomy on 03/25/2020 with 4.5 cm multifocal invasive lobular carcinoma, grade 2, LCIS, 1 lymph node with micrometastasis, margins negative. -Anastrozole started on 04/08/2020.   2.  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 -Sister has breast cancer 9 years ago.  Maternal uncle had cancer unknown to the patient.  Another younger sister is diagnosed with breast cancer.   PLAN:  1.  Stage II left breast invasive lobular carcinoma: - Physical examination today shows left mastectomy site within normal limits.  Right breast has no palpable masses.  No palpable adenopathy. - Labs from 04/20/2022 shows normal LFTs and CBC. - Mammogram of the right breast (04/23/2022):  BI-RADS Category 1. - Continue anastrozole daily.  RTC 6 months for follow-up with repeat labs. - She reports that her younger sister was diagnosed with breast cancer recently.  She has total of 2 sisters were diagnosed with breast cancer.  I have recommended genetic testing.   2.  Bone health: - DEXA scan on 04/18/2020 with T score -2.2.  Reviewed DEXA scan from  04/23/2022 with T score -2.4. - We talked about initiating Prolia injections.  We discussed side effects in detail.  She is not interested at this time. - Vitamin D level is 41.  Continue calcium and vitamin D supplements.  Breast Cancer therapy associated bone loss: I have recommended calcium, Vitamin D and weight bearing exercises.  Orders placed this encounter:  Orders Placed This Encounter  Procedures   Genetic Screening Order   CBC with Differential   Comprehensive metabolic panel   VITAMIN D 25 Hydroxy (Vit-D Deficiency, Fractures)     The patient has a good understanding of the overall plan. She agrees with it. She will call with any problems that may develop before the next visit here.  Derek Jack, MD [place] [phone]

## 2022-04-27 NOTE — Patient Instructions (Addendum)
[place] at [facility] Discharge Instructions   You were seen and examined today by Dr. Delton Coombes.  He reviewed the results of your lab work which are normal/stable.   Continue anastrozole as prescribed.   We will see you back in 6 months. We will obtain blood work prior to your next visit.    Thank you for choosing [place] at [facility] to provide your oncology and hematology care.  To afford each patient quality time with our provider, please arrive at least 15 minutes before your scheduled appointment time.   If you have a lab appointment with the [facility] please come in thru the Main Entrance and check in at the main information desk.  You need to re-schedule your appointment should you arrive 10 or more minutes late.  We strive to give you quality time with our providers, and arriving late affects you and other patients whose appointments are after yours.  Also, if you no show three or more times for appointments you may be dismissed from the clinic at the providers discretion.     Again, thank you for choosing [facility].  Our hope is that these requests will decrease the amount of time that you wait before being seen by our physicians.       _____________________________________________________________  Should you have questions after your visit to [facility], please contact our office at [phone] and follow the prompts.  Our office hours are 8:00 a.m. and 4:30 p.m. Monday - Friday.  Please note that voicemails left after 4:00 p.m. may not be returned until the following business day.  We are closed weekends and major holidays.  You do have access to a nurse 24-7, just call the main number to the clinic [phone] and do not press any options, hold on the line and a nurse will answer the phone.    For prescription refill requests, have your pharmacy contact our office and allow 72 hours.     Due to Covid, you will need to wear a mask upon entering the hospital. If you do not have a mask, a mask will be given to you at the Main Entrance upon arrival. For doctor visits, patients may have 1 support person age 42 or older with them. For treatment visits, patients can not have anyone with them due to social distancing guidelines and our immunocompromised population.

## 2022-05-10 DIAGNOSIS — K219 Gastro-esophageal reflux disease without esophagitis: Secondary | ICD-10-CM | POA: Diagnosis not present

## 2022-05-10 DIAGNOSIS — I10 Essential (primary) hypertension: Secondary | ICD-10-CM | POA: Diagnosis not present

## 2022-06-09 DIAGNOSIS — K219 Gastro-esophageal reflux disease without esophagitis: Secondary | ICD-10-CM | POA: Diagnosis not present

## 2022-06-09 DIAGNOSIS — I10 Essential (primary) hypertension: Secondary | ICD-10-CM | POA: Diagnosis not present

## 2022-06-10 DIAGNOSIS — C50412 Malignant neoplasm of upper-outer quadrant of left female breast: Secondary | ICD-10-CM | POA: Diagnosis not present

## 2022-06-10 DIAGNOSIS — Z170 Estrogen receptor positive status [ER+]: Secondary | ICD-10-CM | POA: Diagnosis not present

## 2022-06-11 ENCOUNTER — Telehealth: Payer: Medicare PPO | Admitting: Licensed Clinical Social Worker

## 2022-07-10 DIAGNOSIS — K219 Gastro-esophageal reflux disease without esophagitis: Secondary | ICD-10-CM | POA: Diagnosis not present

## 2022-07-10 DIAGNOSIS — I10 Essential (primary) hypertension: Secondary | ICD-10-CM | POA: Diagnosis not present

## 2022-08-05 DIAGNOSIS — I10 Essential (primary) hypertension: Secondary | ICD-10-CM | POA: Diagnosis not present

## 2022-08-05 DIAGNOSIS — Z1331 Encounter for screening for depression: Secondary | ICD-10-CM | POA: Diagnosis not present

## 2022-08-05 DIAGNOSIS — Z6830 Body mass index (BMI) 30.0-30.9, adult: Secondary | ICD-10-CM | POA: Diagnosis not present

## 2022-08-05 DIAGNOSIS — Z1389 Encounter for screening for other disorder: Secondary | ICD-10-CM | POA: Diagnosis not present

## 2022-08-05 DIAGNOSIS — Z0001 Encounter for general adult medical examination with abnormal findings: Secondary | ICD-10-CM | POA: Diagnosis not present

## 2022-08-05 DIAGNOSIS — C50912 Malignant neoplasm of unspecified site of left female breast: Secondary | ICD-10-CM | POA: Diagnosis not present

## 2022-08-05 DIAGNOSIS — K219 Gastro-esophageal reflux disease without esophagitis: Secondary | ICD-10-CM | POA: Diagnosis not present

## 2022-08-17 DIAGNOSIS — Z23 Encounter for immunization: Secondary | ICD-10-CM | POA: Diagnosis not present

## 2022-08-26 ENCOUNTER — Other Ambulatory Visit (HOSPITAL_COMMUNITY): Payer: Self-pay | Admitting: Hematology

## 2022-08-26 ENCOUNTER — Encounter: Payer: Self-pay | Admitting: *Deleted

## 2022-08-26 DIAGNOSIS — C50912 Malignant neoplasm of unspecified site of left female breast: Secondary | ICD-10-CM

## 2022-08-26 NOTE — Progress Notes (Signed)
Anastrozole refill approved.  Patient is tolerating and is to continue therapy.  

## 2022-09-05 DIAGNOSIS — I10 Essential (primary) hypertension: Secondary | ICD-10-CM | POA: Diagnosis not present

## 2022-09-05 DIAGNOSIS — K219 Gastro-esophageal reflux disease without esophagitis: Secondary | ICD-10-CM | POA: Diagnosis not present

## 2022-10-06 DIAGNOSIS — K219 Gastro-esophageal reflux disease without esophagitis: Secondary | ICD-10-CM | POA: Diagnosis not present

## 2022-10-06 DIAGNOSIS — I10 Essential (primary) hypertension: Secondary | ICD-10-CM | POA: Diagnosis not present

## 2022-10-14 ENCOUNTER — Ambulatory Visit: Payer: Medicare PPO | Attending: Medical | Admitting: Medical

## 2022-10-14 ENCOUNTER — Encounter: Payer: Self-pay | Admitting: Medical

## 2022-10-14 VITALS — BP 134/62 | HR 77 | Ht 65.0 in | Wt 184.0 lb

## 2022-10-14 DIAGNOSIS — I10 Essential (primary) hypertension: Secondary | ICD-10-CM

## 2022-10-14 DIAGNOSIS — I5189 Other ill-defined heart diseases: Secondary | ICD-10-CM

## 2022-10-14 MED ORDER — AMLODIPINE BESYLATE 10 MG PO TABS: 10.0000 mg | ORAL_TABLET | Freq: Every day | ORAL | 3 refills | Status: DC

## 2022-10-14 NOTE — Patient Instructions (Signed)
Medication Instructions:  Your physician has recommended you make the following change in your medication:  Increase Amlodipine to 10 mg tablets once daily   Labwork: None  Testing/Procedures: None  Follow-Up: Follow up with Dr. Harl Bowie in 6 months.   Any Other Special Instructions Will Be Listed Below (If Applicable).     If you need a refill on your cardiac medications before your next appointment, please call your pharmacy.

## 2022-10-14 NOTE — Progress Notes (Signed)
Cardiology Office Note:    Date:  10/14/2022   ID:  Creola Corn, DOB [birth year], MRN [id number]  PCP:  Carrolyn Meiers, MD  [facility] Cardiologist:  Carlyle Dolly, MD  [facility] Electrophysiologist:  None   Referring MD: Carrolyn Meiers*   Chief Complaint: 72-monthfollow-up  History of Present Illness:    Susan DOEHRINGis a 89y.o. adult with a hx of hypertension, breast cancer status postsurgery and radiation, GERD, diastolic dysfunction who is being seen for follow-up.  She previously established with cardiology for shortness of breath.  Echo November 2017 showed mild LVH, EF 60 to 6123456 grade 1 diastolic dysfunction, mild LAE, aortic sclerosis without stenosis.  Nuclear stress test thousand 17 was low risk without ischemia, hypertensive response to exercise.  She was seen in May 2023 at which time BP control was pursued.  Patient was last seen 03/05/2022 and was overall doing well.  Blood pressure was fairly well-controlled.  Today,the patient reports she is overall doing well. She brought her Bps and log shows SBP 130-150s. She has some inner ear issues, but dizziness or lightheadedness. She denies chest pain or shortness of breath. Has some abdominal pain when she coughs. She has occasional swelling on her feet. She denies orthopnea or pnd. She doesn't need refills of anything. She is scheduled for lab work next week.   Past Medical History:  Diagnosis Date   Bowel obstruction ([place]    Essential (primary) hypertension    Gastro-esophageal reflux disease with esophagitis    Nontoxic goiter, unspecified    Other fatigue    Shortness of breath     Past Surgical History:  Procedure Laterality Date   APPENDECTOMY     CATARACT EXTRACTION, BILATERAL     HERNIA REPAIR     MASTECTOMY MODIFIED RADICAL Left 03/25/2020   Procedure: MASTECTOMY MODIFIED RADICAL;  Surgeon: JAviva Signs MD;  Location: [facility];  Service: General;  Laterality: Left;   SMALL  INTESTINE SURGERY     THYROIDECTOMY      Current Medications: Current Meds  Medication Sig   amLODipine (NORVASC) 10 MG tablet Take 1 tablet (10 mg total) by mouth daily.   anastrozole (ARIMIDEX) 1 MG tablet TAKE (1) TABLET BY MOUTH ONCE DAILY.   Calcium Carbonate-Vitamin D (CALTRATE 600+D PO) Take by mouth daily.   chlorthalidone (HYGROTON) 25 MG tablet Take 1 tablet (25 mg total) by mouth daily.   irbesartan (AVAPRO) 150 MG tablet Take 1 tablet (150 mg total) by mouth daily.   magnesium gluconate (MAGONATE) 500 MG tablet Take 320 mg by mouth daily.   Multiple Vitamin (MULTIVITAMIN WITH MINERALS) TABS tablet Take 1 tablet by mouth daily. Centrum Silver   omeprazole (PRILOSEC) 20 MG capsule Take 20 mg by mouth at bedtime.    potassium chloride SA (KLOR-CON M) 20 MEQ tablet Take 2 tablets (40 mEq total) by mouth daily.   [DISCONTINUED] amLODipine (NORVASC) 5 MG tablet TAKE ONE TABLET BY MOUTH ONCE DAILY. (Patient taking differently: Take 5 mg by mouth daily.)     Allergies:   Patient has no known allergies.   Social History   Socioeconomic History   Marital status: Divorced    Spouse name: Not on file   Number of children: Not on file   Years of education: Not on file   Highest education level: Not on file  Occupational History   Occupation: retired  Tobacco Use   Smoking status: Former    Packs/day: 1.00  Types: Cigarettes    Start date: 07/15/1954    Quit date: 07/16/1999    Years since quitting: 23.2   Smokeless tobacco: Never  Vaping Use   Vaping Use: Never used  Substance and Sexual Activity   Alcohol use: Not Currently    Alcohol/week: 0.0 standard drinks of alcohol   Drug use: Not Currently   Sexual activity: Not Currently  Other Topics Concern   Not on file  Social History Narrative   Not on file   Social Determinants of Health   Financial Resource Strain: Low Risk  (04/05/2020)   Overall Financial Resource Strain (CARDIA)    Difficulty of Paying Living  Expenses: Not hard at all  Food Insecurity: No Food Insecurity (04/05/2020)   Hunger Vital Sign    Worried About Running Out of Food in the Last Year: Never true    Ran Out of Food in the Last Year: Never true  Transportation Needs: No Transportation Needs (04/05/2020)   PRAPARE - Hydrologist (Medical): No    Lack of Transportation (Non-Medical): No  Physical Activity: Insufficiently Active (04/05/2020)   Exercise Vital Sign    Days of Exercise per Week: 2 days    Minutes of Exercise per Session: 20 min  Stress: No Stress Concern Present (04/05/2020)   [place]    Feeling of Stress : Only a little  Social Connections: Moderately Isolated (04/05/2020)   Social Connection and Isolation Panel [NHANES]    Frequency of Communication with Friends and Family: More than three times a week    Frequency of Social Gatherings with Friends and Family: More than three times a week    Attends Religious Services: More than 4 times per year    Active Member of [employer] or Organizations: No    Attends Archivist Meetings: Never    Marital Status: Divorced     Family History: The patient's family history includes Breast cancer in his sister; Heart disease in his paternal grandmother.  ROS:   Please see the history of present illness.     All other systems reviewed and are negative.  EKGs/Labs/Other Studies Reviewed:    The following studies were reviewed today:  Echo 07/2015 Study Conclusions   - Left ventricle: The cavity size was normal. Wall thickness was    increased in a pattern of mild LVH. Systolic function was normal.    The estimated ejection fraction was in the range of 60% to 65%.    Wall motion was normal; there were no regional wall motion    abnormalities. Doppler parameters are consistent with abnormal    left ventricular relaxation (grade 1 diastolic dysfunction).    Indeterminate  filling pressures.  - Aortic valve: Aortic valve sclerosis without stenosis. Mildly    thickened, mildly calcified leaflets.  - Left atrium: The atrium was mildly dilated. Volume/bsa, ES,    (1-plane Simpson&'s, A2C): 30 ml/m^2.   Myoview Lexiscan 2017 Narrative & Impression  Blood pressure demonstrated a hypertensive response to exercise. There was no ST segment deviation noted during stress. The study is normal. There are no perfusion defects. This is a low risk study. The left ventricular ejection fraction is hyperdynamic (>65%).    EKG:  EKG is not ordered today.   Recent Labs: 01/12/2022: TSH 1.494 03/09/2022: Magnesium 2.0 04/20/2022: ALT 24; BUN 17; Creatinine, Ser 1.06; Hemoglobin 14.3; Platelets 308; Potassium 3.8; Sodium 137  Recent Lipid Panel  Component Value Date/Time   CHOL 190 01/17/2019 0000   TRIG 94 01/17/2019 0000   HDL 46 01/17/2019 0000   LDLCALC 105 01/17/2019 0000    Physical Exam:    VS:  BP 134/62   Pulse 77   Ht 5' 5"$  (1.651 m)   Wt 184 lb (83.5 kg)   SpO2 96%   BMI 30.62 kg/m     Wt Readings from Last 3 Encounters:  10/14/22 184 lb (83.5 kg)  04/27/22 176 lb 9.6 oz (80.1 kg)  03/05/22 185 lb (83.9 kg)     GEN:  Well nourished, well developed in no acute distress HEENT: Normal NECK: No JVD; No carotid bruits LYMPHATICS: No lymphadenopathy CARDIAC: RRR, no murmurs, rubs, gallops RESPIRATORY:  Clear to auscultation without rales, wheezing or rhonchi  ABDOMEN: Soft, non-tender, non-distended MUSCULOSKELETAL:  No edema; No deformity  SKIN: Warm and dry NEUROLOGIC:  Alert and oriented x 3 PSYCHIATRIC:  Normal affect   ASSESSMENT:    1. Essential hypertension   2. Diastolic dysfunction without heart failure    PLAN:    In order of problems listed above:  Hypertension BP is good today. The patient does have some higher pressures at home. I will increase amlodipine to 18m daily. Continue Chlorthalidone 247mdaily and Irbesartan  15028maily.   Diastolic dysfunction [id number] echo in 2016. The patient is euvolemic on exam. Continue chlorthalidone 70m18mily.  Disposition: Follow up in 6 month(s) with MD    Signed, Cheryl Chay H FuNinfa Meeker-C  10/14/2022 1:14 PM    [facility]

## 2022-10-20 ENCOUNTER — Inpatient Hospital Stay: Payer: Medicare PPO | Attending: Hematology

## 2022-10-20 DIAGNOSIS — C50912 Malignant neoplasm of unspecified site of left female breast: Secondary | ICD-10-CM | POA: Insufficient documentation

## 2022-10-20 DIAGNOSIS — Z79811 Long term (current) use of aromatase inhibitors: Secondary | ICD-10-CM | POA: Insufficient documentation

## 2022-10-20 DIAGNOSIS — M8580 Other specified disorders of bone density and structure, unspecified site: Secondary | ICD-10-CM | POA: Insufficient documentation

## 2022-10-20 DIAGNOSIS — I10 Essential (primary) hypertension: Secondary | ICD-10-CM | POA: Diagnosis not present

## 2022-10-20 DIAGNOSIS — Z170 Estrogen receptor positive status [ER+]: Secondary | ICD-10-CM | POA: Diagnosis not present

## 2022-10-20 DIAGNOSIS — Z87891 Personal history of nicotine dependence: Secondary | ICD-10-CM | POA: Diagnosis not present

## 2022-10-20 DIAGNOSIS — Z803 Family history of malignant neoplasm of breast: Secondary | ICD-10-CM | POA: Diagnosis not present

## 2022-10-20 LAB — CBC WITH DIFFERENTIAL/PLATELET
Abs Immature Granulocytes: 0.03 10*3/uL (ref 0.00–0.07)
Basophils Absolute: 0.1 10*3/uL (ref 0.0–0.1)
Basophils Relative: 1 %
Eosinophils Absolute: 0 10*3/uL (ref 0.0–0.5)
Eosinophils Relative: 0 %
HCT: 40.8 % (ref 36.0–46.0)
Hemoglobin: 13.6 g/dL (ref 12.0–15.0)
Immature Granulocytes: 1 %
Lymphocytes Relative: 28 %
Lymphs Abs: 1.8 10*3/uL (ref 0.7–4.0)
MCH: 29.7 pg (ref 26.0–34.0)
MCHC: 33.3 g/dL (ref 30.0–36.0)
MCV: 89.1 fL (ref 80.0–100.0)
Monocytes Absolute: 0.5 10*3/uL (ref 0.1–1.0)
Monocytes Relative: 8 %
Neutro Abs: 4 10*3/uL (ref 1.7–7.7)
Neutrophils Relative %: 62 %
Platelets: 323 10*3/uL (ref 150–400)
RBC: 4.58 MIL/uL (ref 3.87–5.11)
RDW: 14.5 % (ref 11.5–15.5)
WBC: 6.5 10*3/uL (ref 4.0–10.5)
nRBC: 0 % (ref 0.0–0.2)

## 2022-10-20 LAB — COMPREHENSIVE METABOLIC PANEL
ALT: 23 U/L (ref 0–44)
AST: 23 U/L (ref 15–41)
Albumin: 3.9 g/dL (ref 3.5–5.0)
Alkaline Phosphatase: 68 U/L (ref 38–126)
Anion gap: 10 (ref 5–15)
BUN: 16 mg/dL (ref 8–23)
CO2: 24 mmol/L (ref 22–32)
Calcium: 9.9 mg/dL (ref 8.9–10.3)
Chloride: 103 mmol/L (ref 98–111)
Creatinine, Ser: 0.96 mg/dL (ref 0.44–1.00)
GFR, Estimated: 57 mL/min — ABNORMAL LOW (ref >60)
Glucose, Bld: 139 mg/dL — ABNORMAL HIGH (ref 70–99)
Potassium: 3.9 mmol/L (ref 3.5–5.1)
Sodium: 137 mmol/L (ref 135–145)
Total Bilirubin: 0.8 mg/dL (ref 0.3–1.2)
Total Protein: 7.7 g/dL (ref 6.5–8.1)

## 2022-10-20 LAB — VITAMIN D 25 HYDROXY (VIT D DEFICIENCY, FRACTURES): Vit D, 25-Hydroxy: 45.98 ng/mL (ref 30–100)

## 2022-10-27 ENCOUNTER — Inpatient Hospital Stay: Payer: Medicare PPO | Admitting: Hematology

## 2022-10-27 VITALS — BP 136/66 | HR 90 | Temp 99.4°F | Resp 20 | Ht 65.0 in | Wt 184.5 lb

## 2022-10-27 DIAGNOSIS — Z79811 Long term (current) use of aromatase inhibitors: Secondary | ICD-10-CM | POA: Diagnosis not present

## 2022-10-27 DIAGNOSIS — I10 Essential (primary) hypertension: Secondary | ICD-10-CM | POA: Diagnosis not present

## 2022-10-27 DIAGNOSIS — Z803 Family history of malignant neoplasm of breast: Secondary | ICD-10-CM | POA: Diagnosis not present

## 2022-10-27 DIAGNOSIS — Z87891 Personal history of nicotine dependence: Secondary | ICD-10-CM | POA: Diagnosis not present

## 2022-10-27 DIAGNOSIS — Z170 Estrogen receptor positive status [ER+]: Secondary | ICD-10-CM | POA: Diagnosis not present

## 2022-10-27 DIAGNOSIS — C50912 Malignant neoplasm of unspecified site of left female breast: Secondary | ICD-10-CM | POA: Diagnosis not present

## 2022-10-27 DIAGNOSIS — M8580 Other specified disorders of bone density and structure, unspecified site: Secondary | ICD-10-CM | POA: Diagnosis not present

## 2022-10-27 NOTE — Progress Notes (Signed)
[facility] [address] [place] [id number]    Clinic Day:  10/27/2022  Referring physician: Carrolyn Meiers*  Patient Care Team: Johnette Abraham, MD as PCP - General (Internal Medicine) Harl Bowie Alphonse Guild, MD as PCP - Cardiology (Cardiology) Brien Mates, RN as Oncology Nurse Navigator (Oncology) Donetta Potts, RN as Oncology Nurse Navigator (Oncology)   ASSESSMENT & PLAN:   Assessment: 1.  Stage II (PT2PN77m) left breast invasive lobular carcinoma: -Left breast biopsy on 02/13/2020 shows multifocal invasive lobular carcinoma of 12:30 position and 2:30-3:00, ER 90% positive, PR 100% positive, Ki-67 5%, HER-2 negative. -Left mastectomy on 03/25/2020 with 4.5 cm multifocal invasive lobular carcinoma, grade 2, LCIS, 1 lymph node with micrometastasis, margins negative. -Anastrozole started on 04/08/2020.   2.  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 -Sister has breast cancer 9 years ago.  Maternal uncle had cancer unknown to the patient.  Another younger sister is diagnosed with breast cancer.  3.  Osteopenia: - DEXA scan (04/18/2020) T-score -2.2 - DEXA scan (04/23/2022) T-score -2.4  Plan: 1.  Stage II left breast invasive lobular carcinoma: - Physical examination today shows left mastectomy site is within normal limits.  Right breast has no palpable masses or adenopathy. - Labs from 10/20/2022 with normal LFTs and CBC. - Right breast mammogram (04/23/2022): BI-RADS Category 1. - I have recommended genetic testing as she had 2 other sisters were diagnosed with breast cancer. - Continue anastrozole which she is tolerating well. - RTC 6 months for follow-up after mammogram.   2.  Osteopenia: - We have talked about Prolia injections in the past which she declined. - Continue calcium and vitamin D supplements.  Vitamin D level is 45.   Breast Cancer  therapy associated bone loss: I have recommended calcium, Vitamin D and weight bearing exercises.  Orders Placed This Encounter  Procedures   MM Digital Screening Unilat R    No implants NAS No devices    Standing Status:   Future    Standing Expiration Date:   10/27/2023    Order Specific Question:   Reason for Exam (SYMPTOM  OR DIAGNOSIS REQUIRED)    Answer:   screening for breast cancer    Order Specific Question:   Preferred imaging location?    Answer:   [facility]  CBC with Differential    Standing Status:   Future    Standing Expiration Date:   10/27/2023   Comprehensive metabolic panel    Standing Status:   Future    Standing Expiration Date:   10/27/2023   VITAMIN D 25 Hydroxy (Vit-D Deficiency, Fractures)    Standing Status:   Future    Standing Expiration Date:   10/28/2023   Ambulatory referral to Social Work    Referral Priority:   Routine    Referral Type:   Consultation    Referral Reason:   Specialty Services Required    Referred to Provider:   KDerek Jack MD    Number of Visits Requested:   1       CHIEF COMPLAINT:   Diagnosis: left breast cancer    Cancer Staging  Lobular carcinoma of breast, left ([place] Staging form: Breast, AJCC 8th Edition - Clinical stage from 04/08/2020: Stage IIA (cT2, cN162msn), cM0, G2, ER+, PR+, HER2-) - Unsigned    Prior Therapy: None  Current Therapy:  Working Up   HISTORY OF PRESENT ILLNESS:   Oncology History   No history exists.     INTERVAL HISTORY:   Carry is a 89 y.o. female seen for follow-up of her left breast cancer.  She is tolerating anastrozole very well.  Very rare hot flashes reported.  Denies any arthralgias or myalgias.  Appetite and energy levels are 50%.  No new onset pains.   PAST MEDICAL HISTORY:   Past Medical History: Past Medical History:  Diagnosis Date   Bowel obstruction ([place])    Essential (primary) hypertension    Gastro-esophageal reflux disease with esophagitis     Nontoxic goiter, unspecified    Other fatigue    Shortness of breath     Surgical History: Past Surgical History:  Procedure Laterality Date   APPENDECTOMY     CATARACT EXTRACTION, BILATERAL     HERNIA REPAIR     MASTECTOMY MODIFIED RADICAL Left 03/25/2020   Procedure: MASTECTOMY MODIFIED RADICAL;  Surgeon: Aviva Signs, MD;  Location: [facility];  Service: General;  Laterality: Left;   SMALL INTESTINE SURGERY     THYROIDECTOMY      Social History: Social History   Socioeconomic History   Marital status: Divorced    Spouse name: Not on file   Number of children: Not on file   Years of education: Not on file   Highest education level: Not on file  Occupational History   Occupation: retired  Tobacco Use   Smoking status: Former    Packs/day: 1.00    Types: Cigarettes    Start date: 07/15/1954    Quit date: 07/16/1999    Years since quitting: 23.2   Smokeless tobacco: Never  Vaping Use   Vaping Use: Never used  Substance and Sexual Activity   Alcohol use: Not Currently    Alcohol/week: 0.0 standard drinks of alcohol   Drug use: Not Currently   Sexual activity: Not Currently  Other Topics Concern   Not on file  Social History Narrative   Not on file   Social Determinants of Health   Financial Resource Strain: [place]  (04/05/2020)   Overall Financial Resource Strain (CARDIA)    Difficulty of Paying Living Expenses: Not hard at all  Food Insecurity: No Food Insecurity (04/05/2020)   Hunger Vital Sign    Worried About Running Out of Food in the Last Year: Never true    [place] in the Last Year: Never true  Transportation Needs: No Transportation Needs (04/05/2020)   PRAPARE - Hydrologist (Medical): No    Lack of Transportation (Non-Medical): No  Physical Activity: Insufficiently Active (04/05/2020)   Exercise Vital Sign    Days of Exercise per Week: 2 days    Minutes of Exercise per Session: 20 min  Stress: No Stress Concern  Present (04/05/2020)   [place]    Feeling of Stress : Only a little  Social Connections: Moderately Isolated (04/05/2020)   Social Connection and Isolation Panel [NHANES]    Frequency of Communication with Friends and Family: More than three times a week    Frequency of Social Gatherings with Friends and Family: More than three times a week    Attends Religious Services: More than 4 times per year    Active Member of [employer] or Organizations: No    Attends Archivist Meetings: Never    Marital Status: Divorced  Human resources officer Violence:  Not At Risk (04/05/2020)   Humiliation, Afraid, Rape, and Kick questionnaire    Fear of Current or Ex-Partner: No    Emotionally Abused: No    Physically Abused: No    Sexually Abused: No    Family History: Family History  Problem Relation Age of Onset   Breast cancer Sister    Heart disease Paternal Grandmother     Current Medications:  Current Outpatient Medications:    amLODipine (NORVASC) 10 MG tablet, Take 1 tablet (10 mg total) by mouth daily., Disp: 90 tablet, Rfl: 3   anastrozole (ARIMIDEX) 1 MG tablet, TAKE (1) TABLET BY MOUTH ONCE DAILY., Disp: 90 tablet, Rfl: 0   Ascorbic Acid (VITAMIN C) 1000 MG tablet, Take 1,000 mg by mouth daily., Disp: , Rfl:    Calcium Carbonate-Vitamin D (CALTRATE 600+D PO), Take by mouth daily., Disp: , Rfl:    chlorthalidone (HYGROTON) 25 MG tablet, Take 1 tablet (25 mg total) by mouth daily., Disp: 90 tablet, Rfl: 3   irbesartan (AVAPRO) 150 MG tablet, Take 1 tablet (150 mg total) by mouth daily., Disp: 90 tablet, Rfl: 3   magnesium gluconate (MAGONATE) 500 MG tablet, Take 320 mg by mouth daily., Disp: , Rfl:    Multiple Vitamin (MULTIVITAMIN WITH MINERALS) TABS tablet, Take 1 tablet by mouth daily. Centrum Silver, Disp: , Rfl:    omeprazole (PRILOSEC) 20 MG capsule, Take 20 mg by mouth at bedtime. , Disp: , Rfl:    potassium chloride SA  (KLOR-CON M) 20 MEQ tablet, Take 2 tablets (40 mEq total) by mouth daily., Disp: 180 tablet, Rfl: 3   Allergies: No Known Allergies  REVIEW OF SYSTEMS:   Review of Systems  Cardiovascular:  Positive for palpitations.  Psychiatric/Behavioral:  Positive for sleep disturbance.   All other systems reviewed and are negative.    VITALS:   Blood pressure 136/66, pulse 90, temperature 99.4 F (37.4 C), resp. rate 20, height '5\' 5"'$  (1.651 m), weight 184 lb 8.4 oz (83.7 kg), SpO2 99 %.  Wt Readings from Last 3 Encounters:  10/27/22 184 lb 8.4 oz (83.7 kg)  10/14/22 184 lb (83.5 kg)  04/27/22 176 lb 9.6 oz (80.1 kg)    Body mass index is 30.71 kg/m.  Performance status (ECOG): 1 - Symptomatic but completely ambulatory  PHYSICAL EXAM:   Physical Exam Vitals reviewed.  Constitutional:      Appearance: Normal appearance.  Cardiovascular:     Rate and Rhythm: Normal rate and regular rhythm.     Heart sounds: Normal heart sounds.  Pulmonary:     Effort: Pulmonary effort is normal.     Breath sounds: Normal breath sounds.  Neurological:     Mental Status: He is alert.  Psychiatric:        Mood and Affect: Mood normal.        Behavior: Behavior normal.     LABS:      Latest Ref Rng & Units 10/20/2022   11:09 AM 04/20/2022    1:26 PM 01/12/2022    2:19 PM  CBC  WBC 4.0 - 10.5 K/uL 6.5  7.0  6.1   Hemoglobin 12.0 - 15.0 g/dL 13.6  14.3  14.0   Hematocrit 36.0 - 46.0 % 40.8  42.6  41.6   Platelets 150 - 400 K/uL 323  308  296       Latest Ref Rng & Units 10/20/2022   11:09 AM 04/20/2022    1:26 PM 03/09/2022    8:54 AM  CMP  Glucose 70 - 99 mg/dL 139  121  134   BUN 8 - 23 mg/dL '16  17  15   '$ Creatinine 0.44 - 1.00 mg/dL 0.96  1.06  0.94   Sodium 135 - 145 mmol/L 137  137  138   Potassium 3.5 - 5.1 mmol/L 3.9  3.8  3.8   Chloride 98 - 111 mmol/L 103  104  104   CO2 22 - 32 mmol/L '24  25  28   '$ Calcium 8.9 - 10.3 mg/dL 9.9  10.1  9.7   Total Protein 6.5 - 8.1 g/dL 7.7  8.2     Total Bilirubin 0.3 - 1.2 mg/dL 0.8  0.5    Alkaline Phos 38 - 126 U/L 68  65    AST 15 - 41 U/L 23  21    ALT 0 - 44 U/L 23  24       No results found for: "CEA1", "CEA" / No results found for: "CEA1", "CEA" No results found for: "PSA1" No results found for: "[id number]" No results found for: "CAN125"  No results found for: "TOTALPROTELP", "ALBUMINELP", "A1GS", "A2GS", "BETS", "BETA2SER", "GAMS", "MSPIKE", "SPEI" No results found for: "TIBC", "FERRITIN", "IRONPCTSAT" No results found for: "LDH"   STUDIES:   No results found.

## 2022-10-27 NOTE — Patient Instructions (Addendum)
[place] at [facility] Discharge Instructions   You were seen and examined today by Dr. Delton Coombes.  He reviewed the results of your mammogram which are normal.   He reviewed the results of your lab work which are normal/stable.   Continue anastrozole daily as prescribed.   We will see you back in 6 months. We will repeat lab work and a mammogram prior to this visit.    Thank you for choosing [place] at [facility] to provide your oncology and hematology care.  To afford each patient quality time with our provider, please arrive at least 15 minutes before your scheduled appointment time.   If you have a lab appointment with the [facility] please come in thru the Main Entrance and check in at the main information desk.  You need to re-schedule your appointment should you arrive 10 or more minutes late.  We strive to give you quality time with our providers, and arriving late affects you and other patients whose appointments are after yours.  Also, if you no show three or more times for appointments you may be dismissed from the clinic at the providers discretion.     Again, thank you for choosing [facility].  Our hope is that these requests will decrease the amount of time that you wait before being seen by our physicians.       _____________________________________________________________  Should you have questions after your visit to [facility], please contact our office at [phone] and follow the prompts.  Our office hours are 8:00 a.m. and 4:30 p.m. Monday - Friday.  Please note that voicemails left after 4:00 p.m. may not be returned until the following business day.  We are closed weekends and major holidays.  You do have access to a nurse 24-7, just call the main number to the clinic [phone] and do not press any options, hold on the line and a nurse will answer the phone.    For prescription  refill requests, have your pharmacy contact our office and allow 72 hours.    Due to Covid, you will need to wear a mask upon entering the hospital. If you do not have a mask, a mask will be given to you at the Main Entrance upon arrival. For doctor visits, patients may have 1 support person age 65 or older with them. For treatment visits, patients can not have anyone with them due to social distancing guidelines and our immunocompromised population.

## 2022-11-05 ENCOUNTER — Inpatient Hospital Stay: Payer: Medicare PPO | Attending: Hematology | Admitting: Licensed Clinical Social Worker

## 2022-11-05 DIAGNOSIS — C50912 Malignant neoplasm of unspecified site of left female breast: Secondary | ICD-10-CM

## 2022-11-05 NOTE — Progress Notes (Signed)
[place] CSW Progress Note  Clinical Social Worker  called pt to go over advance directives.  CSW verbally discussed each section of the advance directives form with pt.  CSW suggested pt discuss her decisions regarding her advance directives with her sisters to ensure they understand her wishes.  Pt states her sister Casper Harrison [phone]) will be designated as her 1st HCP and her sister Andee Poles [phone]) will be designated as her 2nd HCP.  Pt has booked an appointment with CSW on 4/4 to have the document notarized and it will be uploaded to pt's chart at that time.  CSW to remain available as appropriate.        [name], LCSW

## 2022-11-09 ENCOUNTER — Ambulatory Visit: Payer: Medicare PPO | Admitting: Internal Medicine

## 2022-11-09 ENCOUNTER — Encounter: Payer: Self-pay | Admitting: Internal Medicine

## 2022-11-09 VITALS — BP 139/71 | HR 81 | Ht 65.0 in | Wt 181.4 lb

## 2022-11-09 DIAGNOSIS — I10 Essential (primary) hypertension: Secondary | ICD-10-CM | POA: Diagnosis not present

## 2022-11-09 DIAGNOSIS — Z0001 Encounter for general adult medical examination with abnormal findings: Secondary | ICD-10-CM | POA: Diagnosis not present

## 2022-11-09 DIAGNOSIS — C50912 Malignant neoplasm of unspecified site of left female breast: Secondary | ICD-10-CM | POA: Diagnosis not present

## 2022-11-09 DIAGNOSIS — K2100 Gastro-esophageal reflux disease with esophagitis, without bleeding: Secondary | ICD-10-CM

## 2022-11-09 NOTE — Assessment & Plan Note (Signed)
No medication changes today.  Previous records and labs been reviewed. -Labs have been updated within the past year -I recommended she receive outstanding zoster and Tdap vaccinations at her pharmacy -We will tentatively plan for follow-up in 6 months

## 2022-11-09 NOTE — Patient Instructions (Signed)
It was a pleasure to see you today.  Thank you for giving [place] the opportunity to be involved in your care.  Below is a brief recap of your visit and next steps.  We will plan to see you again in 6 months.  Summary You have established care today We will request records from Dr. Legrand Rams and plan for follow up in 6 months.

## 2022-11-09 NOTE — Assessment & Plan Note (Signed)
She is currently prescribed amlodipine 10 mg daily, chlorthalidone 25 mg daily, and irbesartan 150 mg daily.  Her blood pressure today is 139/71. -No medication changes today

## 2022-11-09 NOTE — Assessment & Plan Note (Signed)
History of lobular carcinoma of the left breast diagnosed in 2021.  She underwent radical mastectomy followed by radiation.  She is followed by oncology (Dr. Delton Coombes) and is prescribed anastrozole 1 mg daily.  Last seen by oncology for follow-up in February. -No medication changes today.  Oncology follow-up is scheduled for September 2024

## 2022-11-09 NOTE — Assessment & Plan Note (Signed)
Symptoms are currently well-controlled with omeprazole 20 mg as needed. -No medication changes today

## 2022-11-09 NOTE — Progress Notes (Signed)
New Patient Office Visit  Subjective    Patient ID: Susan Beck, adult    DOB: Aug 23, 1932  Age: 89 y.o. MRN: [id number]  CC:  Chief Complaint  Patient presents with   Establish Care   HPI Susan Beck presents to establish care.  She is an 89 year old woman who endorses a past medical history significant for lobular carcinoma of the left breast s/p radical mastectomy and radiation (2021), HTN, and GERD.  She has most recently been followed by Dr. Legrand Beck.  Susan Beck reports feeling well today.  She is asymptomatic and has no acute concerns to discuss.  She previously worked in [employer], [employer].  Susan Beck is a former smoker, smoking 1 pack of cigarettes per day for 45 years prior to quitting in 2000.  She denies alcohol and illicit drug use.  Her family medical history is remarkable for breast cancer in her sister and CAD in her father.  Chronic medical conditions and outstanding preventative care items discussed today are individually addressed A/P below.  Outpatient Encounter Medications as of 11/09/2022  Medication Sig   amLODipine (NORVASC) 10 MG tablet Take 1 tablet (10 mg total) by mouth daily.   anastrozole (ARIMIDEX) 1 MG tablet TAKE (1) TABLET BY MOUTH ONCE DAILY.   Calcium Carbonate-Vitamin D (CALTRATE 600+D PO) Take by mouth daily.   chlorthalidone (HYGROTON) 25 MG tablet Take 1 tablet (25 mg total) by mouth daily.   irbesartan (AVAPRO) 150 MG tablet Take 1 tablet (150 mg total) by mouth daily.   magnesium gluconate (MAGONATE) 500 MG tablet Take 320 mg by mouth daily.   Multiple Vitamin (MULTIVITAMIN WITH MINERALS) TABS tablet Take 1 tablet by mouth daily. Centrum Silver   omeprazole (PRILOSEC) 20 MG capsule Take 20 mg by mouth daily as needed.   potassium chloride SA (KLOR-CON M) 20 MEQ tablet Take 2 tablets (40 mEq total) by mouth daily.   [DISCONTINUED] Ascorbic Acid (VITAMIN C) 1000 MG tablet Take 1,000 mg by mouth daily.   No facility-administered encounter medications  on file as of 11/09/2022.    Past Medical History:  Diagnosis Date   Bowel obstruction ([place])    Essential (primary) hypertension    Gastro-esophageal reflux disease with esophagitis    Nontoxic goiter, unspecified    Other fatigue    Shortness of breath     Past Surgical History:  Procedure Laterality Date   APPENDECTOMY     CATARACT EXTRACTION, BILATERAL     HERNIA REPAIR     MASTECTOMY MODIFIED RADICAL Left 03/25/2020   Procedure: MASTECTOMY MODIFIED RADICAL;  Surgeon: Susan Signs, MD;  Location: [facility];  Service: General;  Laterality: Left;   SMALL INTESTINE SURGERY     THYROIDECTOMY      Family History  Problem Relation Age of Onset   Breast cancer Sister    Heart disease Paternal Grandmother     Social History   Socioeconomic History   Marital status: Divorced    Spouse name: Not on file   Number of children: Not on file   Years of education: Not on file   Highest education level: Not on file  Occupational History   Occupation: retired  Tobacco Use   Smoking status: Former    Packs/day: 1.00    Types: Cigarettes    Start date: 07/15/1954    Quit date: 07/16/1999    Years since quitting: 23.3   Smokeless tobacco: Never  Vaping Use   Vaping Use: Never used  Substance and Sexual  Activity   Alcohol use: Not Currently    Alcohol/week: 0.0 standard drinks of alcohol   Drug use: Not Currently   Sexual activity: Not Currently  Other Topics Concern   Not on file  Social History Narrative   Not on file   Social Determinants of Health   Financial Resource Strain: Low Risk  (04/05/2020)   Overall Financial Resource Strain (CARDIA)    Difficulty of Paying Living Expenses: Not hard at all  Food Insecurity: No Food Insecurity (04/05/2020)   Hunger Vital Sign    Worried About Running Out of Food in the Last Year: Never true    Ran Out of Food in the Last Year: Never true  Transportation Needs: No Transportation Needs (04/05/2020)   PRAPARE - Armed forces logistics/support/administrative officer (Medical): No    Lack of Transportation (Non-Medical): No  Physical Activity: Insufficiently Active (04/05/2020)   Exercise Vital Sign    Days of Exercise per Week: 2 days    Minutes of Exercise per Session: 20 min  Stress: No Stress Concern Present (04/05/2020)   [place]    Feeling of Stress : Only a little  Social Connections: Moderately Isolated (04/05/2020)   Social Connection and Isolation Panel [NHANES]    Frequency of Communication with Friends and Family: More than three times a week    Frequency of Social Gatherings with Friends and Family: More than three times a week    Attends Religious Services: More than 4 times per year    Active Member of [employer] or Organizations: No    Attends Archivist Meetings: Never    Marital Status: Divorced  Human resources officer Violence: Not At Risk (04/05/2020)   Humiliation, Afraid, Rape, and Kick questionnaire    Fear of Current or Ex-Partner: No    Emotionally Abused: No    Physically Abused: No    Sexually Abused: No    Review of Systems  Constitutional:  Negative for chills and fever.  HENT:  Negative for sore throat.   Respiratory:  Negative for cough and shortness of breath.   Cardiovascular:  Negative for chest pain, palpitations and leg swelling.  Gastrointestinal:  Negative for abdominal pain, blood in stool, constipation, diarrhea, nausea and vomiting.  Genitourinary:  Negative for dysuria and hematuria.  Musculoskeletal:  Negative for myalgias.  Skin:  Negative for itching and rash.  Neurological:  Negative for dizziness and headaches.  Psychiatric/Behavioral:  Negative for depression and suicidal ideas.         Objective    BP 139/71   Pulse 81   Ht '5\' 5"'$  (1.651 m)   Wt 181 lb 6.4 oz (82.3 kg)   SpO2 98%   BMI 30.19 kg/m   Physical Exam Vitals reviewed.  Constitutional:      General: He is not in acute distress.     Appearance: Normal appearance. He is not toxic-appearing.  HENT:     Head: Normocephalic and atraumatic.     Right Ear: External ear normal.     Left Ear: External ear normal.     Nose: Nose normal. No congestion or rhinorrhea.     Mouth/Throat:     Mouth: Mucous membranes are moist.     Pharynx: Oropharynx is clear. No oropharyngeal exudate or posterior oropharyngeal erythema.  Eyes:     General: No scleral icterus.    Extraocular Movements: Extraocular movements intact.     Conjunctiva/sclera: Conjunctivae normal.  Pupils: Pupils are equal, round, and reactive to light.  Cardiovascular:     Rate and Rhythm: Normal rate and regular rhythm.     Pulses: Normal pulses.     Heart sounds: Murmur heard.     No friction rub. No gallop.  Pulmonary:     Effort: Pulmonary effort is normal.     Breath sounds: Normal breath sounds. No wheezing, rhonchi or rales.  Abdominal:     General: Abdomen is flat. Bowel sounds are normal. There is no distension.     Palpations: Abdomen is soft.     Tenderness: There is no abdominal tenderness.     Hernia: A hernia (Reducible umbilical hernia) is present.  Musculoskeletal:        General: No swelling. Normal range of motion.     Cervical back: Normal range of motion.     Right lower leg: No edema.     Left lower leg: No edema.  Lymphadenopathy:     Cervical: No cervical adenopathy.  Skin:    General: Skin is warm and dry.     Capillary Refill: Capillary refill takes less than 2 seconds.     Coloration: Skin is not jaundiced.  Neurological:     General: No focal deficit present.     Mental Status: He is alert and oriented to person, place, and time.  Psychiatric:        Mood and Affect: Mood normal.        Behavior: Behavior normal.   Last CBC Lab Results  Component Value Date   WBC 6.5 10/20/2022   HGB 13.6 10/20/2022   HCT 40.8 10/20/2022   MCV 89.1 10/20/2022   MCH 29.7 10/20/2022   RDW 14.5 10/20/2022   PLT 323 0000000    Last metabolic panel Lab Results  Component Value Date   GLUCOSE 139 (H) 10/20/2022   NA 137 10/20/2022   K 3.9 10/20/2022   CL 103 10/20/2022   CO2 24 10/20/2022   BUN 16 10/20/2022   CREATININE 0.96 10/20/2022   GFRNONAA 57 (L) 10/20/2022   CALCIUM 9.9 10/20/2022   PHOS 4.1 03/26/2020   PROT 7.7 10/20/2022   ALBUMIN 3.9 10/20/2022   BILITOT 0.8 10/20/2022   ALKPHOS 68 10/20/2022   AST 23 10/20/2022   ALT 23 10/20/2022   ANIONGAP 10 10/20/2022   Last lipids Lab Results  Component Value Date   CHOL 190 01/17/2019   HDL 46 01/17/2019   LDLCALC 105 01/17/2019   TRIG 94 01/17/2019   Last thyroid functions Lab Results  Component Value Date   TSH 1.494 01/12/2022   Last vitamin D Lab Results  Component Value Date   VD25OH 45.98 10/20/2022   Assessment & Plan:   Problem List Items Addressed This Visit       Essential (primary) hypertension    She is currently prescribed amlodipine 10 mg daily, chlorthalidone 25 mg daily, and irbesartan 150 mg daily.  Her blood pressure today is 139/71. -No medication changes today      Gastro-esophageal reflux disease with esophagitis    Symptoms are currently well-controlled with omeprazole 20 mg as needed. -No medication changes today      Lobular carcinoma of breast, left ([place])    History of lobular carcinoma of the left breast diagnosed in 2021.  She underwent radical mastectomy followed by radiation.  She is followed by oncology (Dr. Delton Coombes) and is prescribed anastrozole 1 mg daily.  Last seen by oncology for follow-up in  February. -No medication changes today.  Oncology follow-up is scheduled for September 2024      Encounter for general adult medical examination with abnormal findings - Primary    No medication changes today.  Previous records and labs been reviewed. -Labs have been updated within the past year -I recommended she receive outstanding zoster and Tdap vaccinations at her pharmacy -We will  tentatively plan for follow-up in 6 months      Return in about 6 months (around 05/12/2023).   Johnette Abraham, MD

## 2022-11-25 ENCOUNTER — Other Ambulatory Visit: Payer: Self-pay | Admitting: Hematology

## 2022-11-25 DIAGNOSIS — C50912 Malignant neoplasm of unspecified site of left female breast: Secondary | ICD-10-CM

## 2022-11-26 ENCOUNTER — Other Ambulatory Visit: Payer: Self-pay | Admitting: *Deleted

## 2022-11-26 NOTE — Telephone Encounter (Signed)
Anastrozole refill approved.  Patient is tolerating and is to continue therapy.  

## 2022-12-03 ENCOUNTER — Inpatient Hospital Stay: Payer: Medicare PPO | Attending: Hematology | Admitting: Licensed Clinical Social Worker

## 2022-12-03 DIAGNOSIS — C50912 Malignant neoplasm of unspecified site of left female breast: Secondary | ICD-10-CM

## 2022-12-03 NOTE — Progress Notes (Signed)
[place] Social Work  Patient presented to [facility]  to review and complete healthcare advance directives.  Clinical Social Worker met with patient.  The patient designated Susan Beck [phone]) as their primary healthcare agent and Susan Beck [phone]) as their secondary agent.  Patient also completed healthcare living will.    Documents were notarized and copies made for patient/family. Clinical Social Worker will send documents to medical records to be scanned into patient's chart. Clinical Social Worker encouraged patient/family to contact with any additional questions or concerns.   [name], [place] Worker [facility]

## 2022-12-08 DIAGNOSIS — E669 Obesity, unspecified: Secondary | ICD-10-CM | POA: Diagnosis not present

## 2022-12-08 DIAGNOSIS — C50919 Malignant neoplasm of unspecified site of unspecified female breast: Secondary | ICD-10-CM | POA: Diagnosis not present

## 2022-12-08 DIAGNOSIS — K5900 Constipation, unspecified: Secondary | ICD-10-CM | POA: Diagnosis not present

## 2022-12-08 DIAGNOSIS — E785 Hyperlipidemia, unspecified: Secondary | ICD-10-CM | POA: Diagnosis not present

## 2022-12-08 DIAGNOSIS — I110 Hypertensive heart disease with heart failure: Secondary | ICD-10-CM | POA: Diagnosis not present

## 2022-12-08 DIAGNOSIS — Z8249 Family history of ischemic heart disease and other diseases of the circulatory system: Secondary | ICD-10-CM | POA: Diagnosis not present

## 2022-12-08 DIAGNOSIS — I509 Heart failure, unspecified: Secondary | ICD-10-CM | POA: Diagnosis not present

## 2022-12-08 DIAGNOSIS — K219 Gastro-esophageal reflux disease without esophagitis: Secondary | ICD-10-CM | POA: Diagnosis not present

## 2022-12-08 DIAGNOSIS — E876 Hypokalemia: Secondary | ICD-10-CM | POA: Diagnosis not present

## 2022-12-17 IMAGING — MG MM DIGITAL SCREENING UNILAT*R* W/ TOMO W/ CAD
6 series · 6 of 18 positions shown · non-contrast
Comparison: Previous exam(s).

CLINICAL DATA: Screening. The patient has had a left mastectomy.

EXAM:
DIGITAL SCREENING UNILATERAL RIGHT MAMMOGRAM WITH CAD AND
TOMOSYNTHESIS
TECHNIQUE: Right screening digital craniocaudal and mediolateral oblique
mammograms were obtained. Right screening digital breast
tomosynthesis was performed. The images were evaluated with
computer-aided detection.

[R MLO synth-2D (1 of 2)]
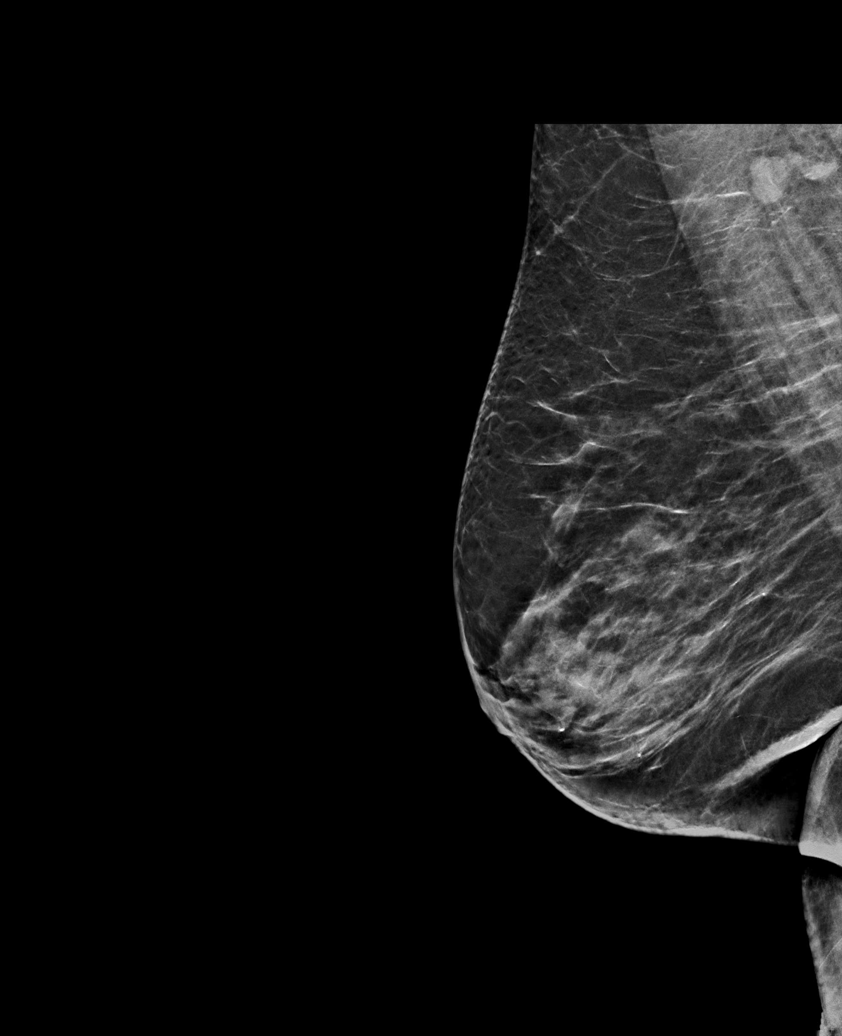

[R MLO synth-2D (2 of 2)]
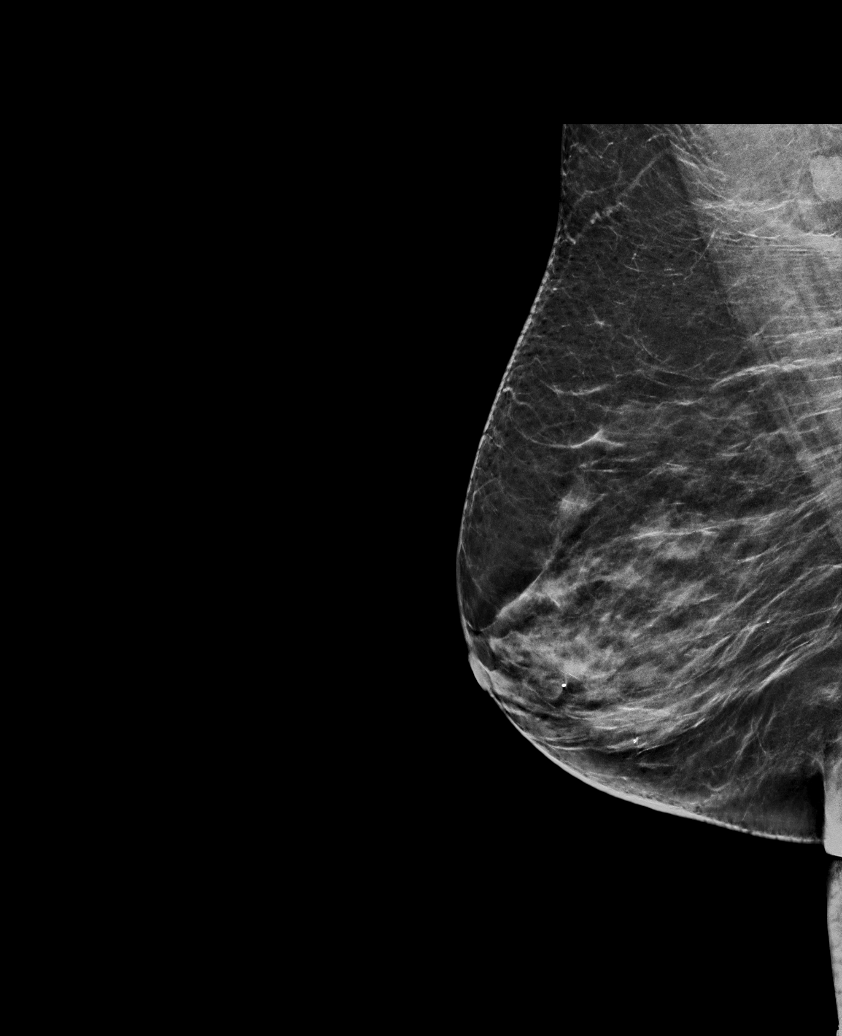

[R CC synth-2D]
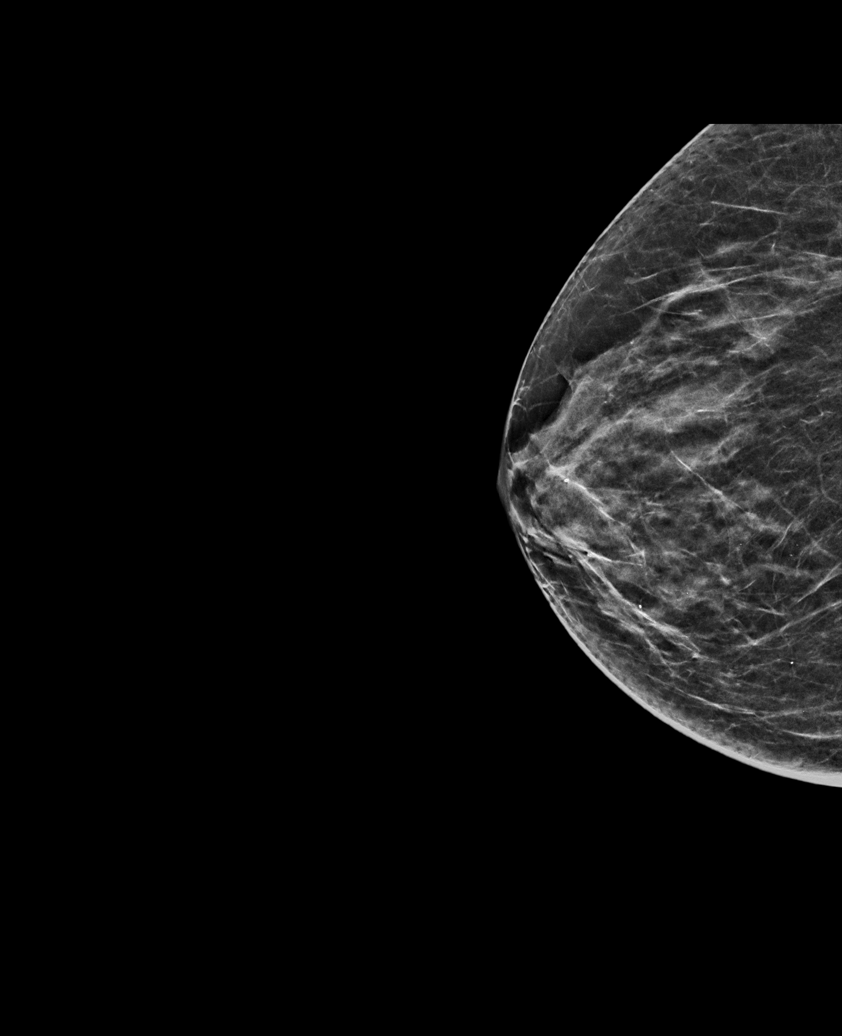

[R CC tomo · tomo slice 29/56.0]
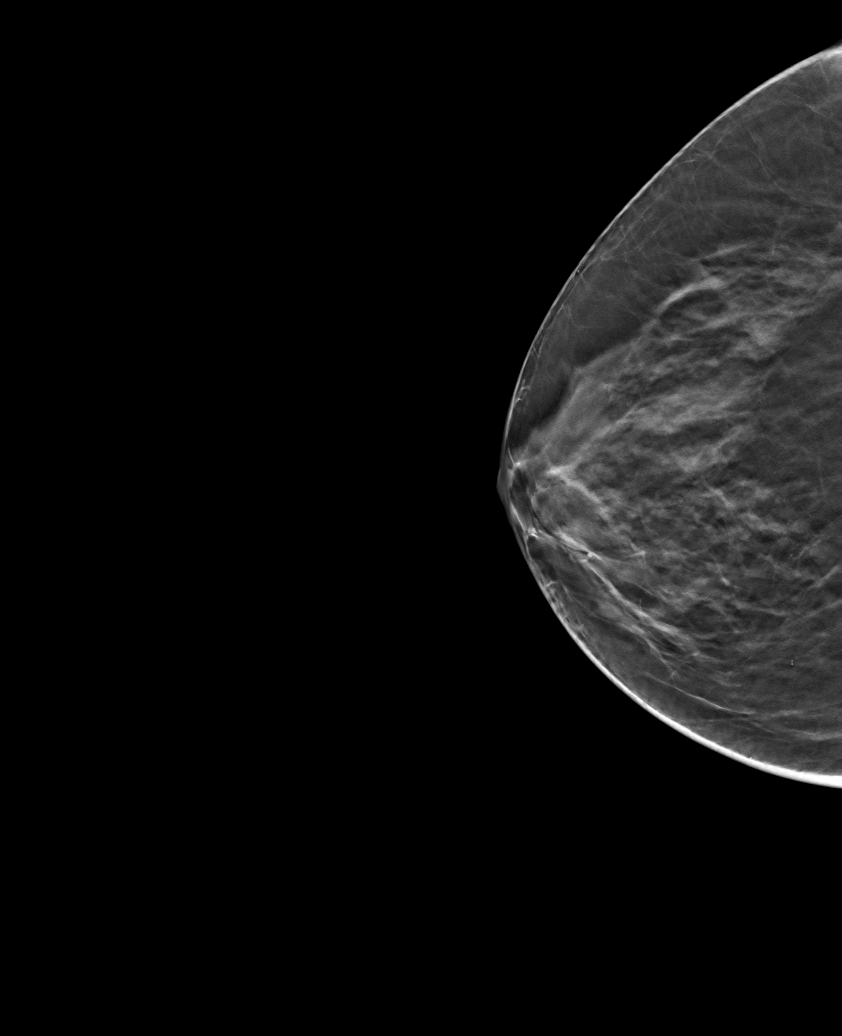

[R MLO tomo (1 of 2) · tomo slice 33/66.0]
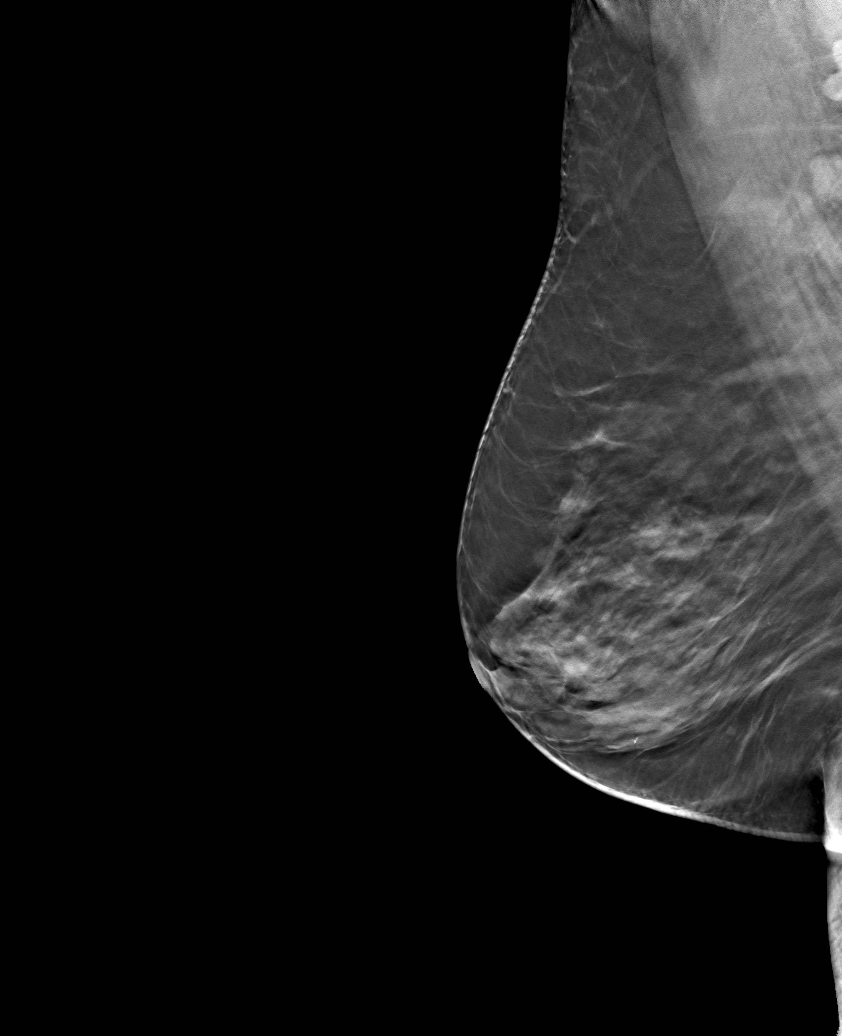

[R MLO tomo (2 of 2) · tomo slice 34/67.0]
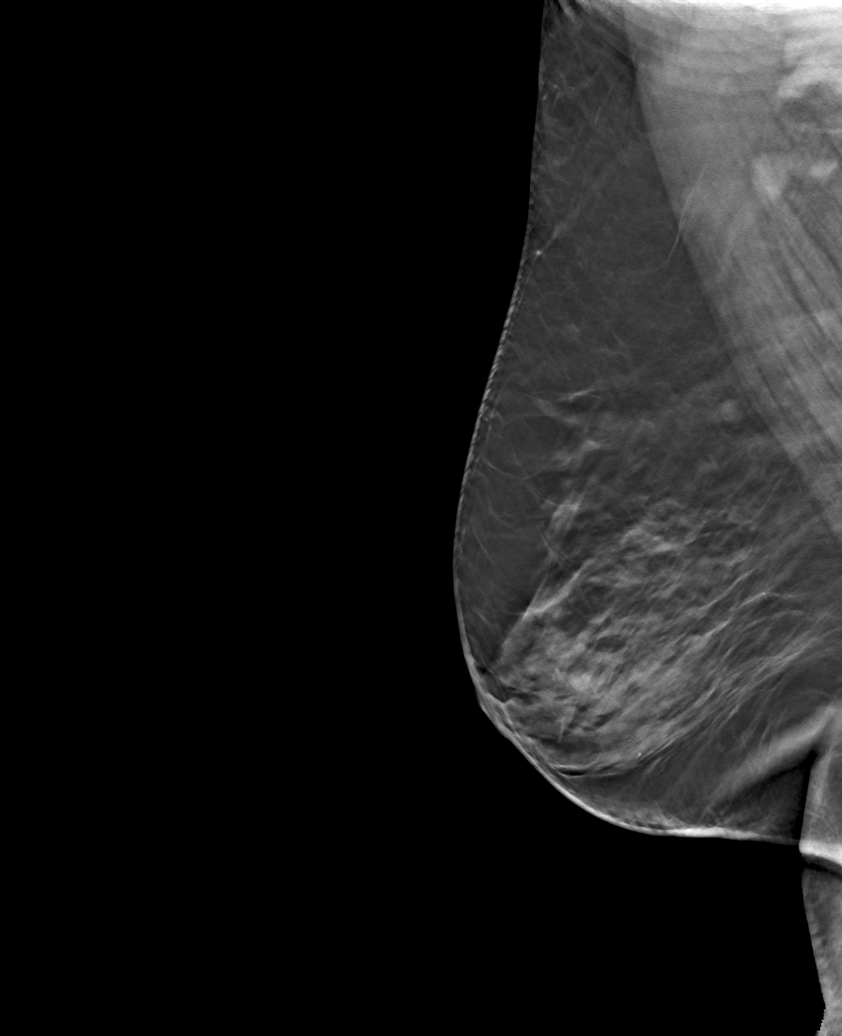

[6 of 18 positions shown; findings below may reference images not displayed]

ACR Breast Density Category c: The breast tissue is heterogeneously
dense, which may obscure small masses.
FINDINGS: There are no findings suspicious for malignancy. The images were
evaluated with computer-aided detection.
IMPRESSION: No mammographic evidence of malignancy. A result letter of this
screening mammogram will be mailed directly to the patient.

RECOMMENDATION:
Screening mammogram in one year. (Code:[id number])

BI-RADS CATEGORY  1: Negative.

## 2022-12-22 ENCOUNTER — Other Ambulatory Visit: Payer: Self-pay | Admitting: Physician Assistant

## 2022-12-22 NOTE — Telephone Encounter (Signed)
This is a [place] pt.  °

## 2022-12-24 ENCOUNTER — Telehealth: Payer: Self-pay | Admitting: Cardiology

## 2022-12-24 NOTE — Telephone Encounter (Signed)
Pt c/o medication issue:  1. Name of Medication:   potassium chloride SA (KLOR-CON M) 20 MEQ tablet    2. How are you currently taking this medication (dosage and times per day)? As written   3. Are you having a reaction (difficulty breathing--STAT)?   4. What is your medication issue? Pharmacy called in asking for someone to clarify dosage because her PCP had her on a different dose. Please advise.

## 2022-12-24 NOTE — Telephone Encounter (Signed)
Spoke with pharmacy informed that we have pt on Potassium 40 mEq daily.

## 2023-02-23 ENCOUNTER — Other Ambulatory Visit: Payer: Self-pay | Admitting: Hematology

## 2023-02-23 ENCOUNTER — Other Ambulatory Visit: Payer: Self-pay | Admitting: *Deleted

## 2023-02-23 DIAGNOSIS — C50912 Malignant neoplasm of unspecified site of left female breast: Secondary | ICD-10-CM

## 2023-02-23 NOTE — Telephone Encounter (Signed)
Anastrozole refill approved.  Patient is tolerating and is to continue therapy.  

## 2023-02-27 ENCOUNTER — Other Ambulatory Visit: Payer: Self-pay | Admitting: Physician Assistant

## 2023-03-15 ENCOUNTER — Other Ambulatory Visit: Payer: Self-pay

## 2023-03-15 MED ORDER — CHLORTHALIDONE 25 MG PO TABS: 25.0000 mg | ORAL_TABLET | Freq: Every day | ORAL | 2 refills | Status: DC

## 2023-04-23 ENCOUNTER — Other Ambulatory Visit: Payer: Self-pay

## 2023-04-26 ENCOUNTER — Ambulatory Visit (HOSPITAL_COMMUNITY)
Admission: RE | Admit: 2023-04-26 | Discharge: 2023-04-26 | Disposition: A | Discharge status: Home / Self Care | Payer: Medicare PPO | Source: Ambulatory Visit | Attending: Hematology | Admitting: Hematology

## 2023-04-26 ENCOUNTER — Inpatient Hospital Stay: Payer: Medicare PPO | Attending: Hematology

## 2023-04-26 DIAGNOSIS — Z1231 Encounter for screening mammogram for malignant neoplasm of breast: Secondary | ICD-10-CM | POA: Insufficient documentation

## 2023-04-26 DIAGNOSIS — C50912 Malignant neoplasm of unspecified site of left female breast: Secondary | ICD-10-CM | POA: Diagnosis not present

## 2023-04-26 DIAGNOSIS — Z170 Estrogen receptor positive status [ER+]: Secondary | ICD-10-CM | POA: Diagnosis not present

## 2023-04-26 DIAGNOSIS — E559 Vitamin D deficiency, unspecified: Secondary | ICD-10-CM | POA: Diagnosis not present

## 2023-04-26 LAB — CBC WITH DIFFERENTIAL/PLATELET
Abs Immature Granulocytes: 0.03 10*3/uL (ref 0.00–0.07)
Basophils Absolute: 0.1 10*3/uL (ref 0.0–0.1)
Basophils Relative: 1 %
Eosinophils Absolute: 0 10*3/uL (ref 0.0–0.5)
Eosinophils Relative: 0 %
HCT: 40.6 % (ref 36.0–46.0)
Hemoglobin: 13.7 g/dL (ref 12.0–15.0)
Immature Granulocytes: 0 %
Lymphocytes Relative: 30 %
Lymphs Abs: 2.1 10*3/uL (ref 0.7–4.0)
MCH: 30.2 pg (ref 26.0–34.0)
MCHC: 33.7 g/dL (ref 30.0–36.0)
MCV: 89.6 fL (ref 80.0–100.0)
Monocytes Absolute: 0.7 10*3/uL (ref 0.1–1.0)
Monocytes Relative: 10 %
Neutro Abs: 4.1 10*3/uL (ref 1.7–7.7)
Neutrophils Relative %: 59 %
Platelets: 328 10*3/uL (ref 150–400)
RBC: 4.53 MIL/uL (ref 3.87–5.11)
RDW: 14.3 % (ref 11.5–15.5)
WBC: 7 10*3/uL (ref 4.0–10.5)
nRBC: 0 % (ref 0.0–0.2)

## 2023-04-26 LAB — COMPREHENSIVE METABOLIC PANEL
ALT: 25 U/L (ref 0–44)
AST: 21 U/L (ref 15–41)
Albumin: 4.1 g/dL (ref 3.5–5.0)
Alkaline Phosphatase: 61 U/L (ref 38–126)
Anion gap: 8 (ref 5–15)
BUN: 18 mg/dL (ref 8–23)
CO2: 26 mmol/L (ref 22–32)
Calcium: 9.6 mg/dL (ref 8.9–10.3)
Chloride: 101 mmol/L (ref 98–111)
Creatinine, Ser: 0.95 mg/dL (ref 0.44–1.00)
GFR, Estimated: 58 mL/min — ABNORMAL LOW (ref >60)
Glucose, Bld: 111 mg/dL — ABNORMAL HIGH (ref 70–99)
Potassium: 3.8 mmol/L (ref 3.5–5.1)
Sodium: 135 mmol/L (ref 135–145)
Total Bilirubin: 0.6 mg/dL (ref 0.3–1.2)
Total Protein: 8.2 g/dL — ABNORMAL HIGH (ref 6.5–8.1)

## 2023-04-26 LAB — VITAMIN D 25 HYDROXY (VIT D DEFICIENCY, FRACTURES): Vit D, 25-Hydroxy: 48.29 ng/mL (ref 30–100)

## 2023-05-04 ENCOUNTER — Inpatient Hospital Stay: Payer: Medicare PPO | Attending: Hematology | Admitting: Hematology

## 2023-05-04 DIAGNOSIS — Z803 Family history of malignant neoplasm of breast: Secondary | ICD-10-CM | POA: Diagnosis not present

## 2023-05-04 DIAGNOSIS — I10 Essential (primary) hypertension: Secondary | ICD-10-CM | POA: Diagnosis not present

## 2023-05-04 DIAGNOSIS — M8580 Other specified disorders of bone density and structure, unspecified site: Secondary | ICD-10-CM | POA: Diagnosis not present

## 2023-05-04 DIAGNOSIS — K2100 Gastro-esophageal reflux disease with esophagitis, without bleeding: Secondary | ICD-10-CM | POA: Insufficient documentation

## 2023-05-04 DIAGNOSIS — Z170 Estrogen receptor positive status [ER+]: Secondary | ICD-10-CM | POA: Diagnosis not present

## 2023-05-04 DIAGNOSIS — C50912 Malignant neoplasm of unspecified site of left female breast: Secondary | ICD-10-CM | POA: Diagnosis not present

## 2023-05-04 DIAGNOSIS — K219 Gastro-esophageal reflux disease without esophagitis: Secondary | ICD-10-CM | POA: Diagnosis not present

## 2023-05-04 DIAGNOSIS — Z79811 Long term (current) use of aromatase inhibitors: Secondary | ICD-10-CM | POA: Insufficient documentation

## 2023-05-04 DIAGNOSIS — Z79899 Other long term (current) drug therapy: Secondary | ICD-10-CM | POA: Diagnosis not present

## 2023-05-04 DIAGNOSIS — R0602 Shortness of breath: Secondary | ICD-10-CM | POA: Diagnosis not present

## 2023-05-04 DIAGNOSIS — Z87891 Personal history of nicotine dependence: Secondary | ICD-10-CM | POA: Diagnosis not present

## 2023-05-04 DIAGNOSIS — R232 Flushing: Secondary | ICD-10-CM | POA: Diagnosis not present

## 2023-05-04 MED ORDER — ANASTROZOLE 1 MG PO TABS
1.0000 mg | ORAL_TABLET | Freq: Every day | ORAL | 3 refills | Status: DC
Start: 2023-05-04 — End: 2023-05-20

## 2023-05-04 NOTE — Progress Notes (Signed)
[facility] [address] [place] [address]    Clinic Day:  05/04/2023  Referring physician: Billie Lade, MD  Patient Care Team: Billie Lade, MD as PCP - General (Internal Medicine) Wyline Mood Dorothe Pea, MD as PCP - Cardiology (Cardiology) Therese Sarah, RN as Oncology Nurse Navigator (Oncology) Mickie Bail, RN as Oncology Nurse Navigator (Oncology)   ASSESSMENT & PLAN:   Assessment: 1.  Stage II (PT2PN64mi) left breast invasive lobular carcinoma: -Left breast biopsy on 02/13/2020 shows multifocal invasive lobular carcinoma of 12:30 position and 2:30-3:00, ER 90% positive, PR 100% positive, Ki-67 5%, HER-2 negative. -Left mastectomy on 03/25/2020 with 4.5 cm multifocal invasive lobular carcinoma, grade 2, LCIS, 1 lymph node with micrometastasis, margins negative. -Anastrozole started on 04/08/2020.   2.  Social/family history: -Patient lives by herself at home and is retired Runner, broadcasting/film/video. -She smoked 1 pack/day for 30 years and quit in 2000. -Sister has breast cancer 9 years ago.  Maternal uncle had cancer unknown to the patient.  Another younger sister is diagnosed with breast cancer.  3.  Osteopenia: - DEXA scan (04/18/2020) T-score -2.2 - DEXA scan (04/23/2022) T-score -2.4  Plan: 1.  Stage II left breast invasive lobular carcinoma: - Physical exam: Left mastectomy site is within normal limits.  Right breast has no palpable masses.  No adenopathy. - Right breast mammogram on 04/26/2023: BI-RADS Category 1.  I have recommended genetic testing previously. - Labs from 04/26/2023: Normal LFTs and CBC. - Continue anastrozole which she is tolerating very well. - RTC 6 months for follow-up.   2.  Osteopenia: - We discussed about Prolia which was declined in the past. - Continue calcium and vitamin D supplements.  Vitamin D level is normal at 48.   Breast Cancer therapy associated bone loss: I have recommended calcium, Vitamin D and weight bearing  exercises.  Orders Placed This Encounter  Procedures   CBC with Differential/Platelet    Standing Status:   Future    Standing Expiration Date:   05/03/2024    Order Specific Question:   Release to patient    Answer:   Immediate   Comprehensive metabolic panel    Standing Status:   Future    Standing Expiration Date:   05/03/2024    Order Specific Question:   Release to patient    Answer:   Immediate    I,Helena R Teague,acting as a scribe for Doreatha Massed, MD.,have documented all relevant documentation on the behalf of Doreatha Massed, MD,as directed by  Doreatha Massed, MD while in the presence of Doreatha Massed, MD.  I, Doreatha Massed MD, have reviewed the above documentation for accuracy and completeness, and I agree with the above.     CHIEF COMPLAINT:   Diagnosis: left breast cancer    Cancer Staging  Lobular carcinoma of breast, left (HCC) Staging form: Breast, AJCC 8th Edition - Clinical stage from 04/08/2020: Stage IIA (cT2, cN40mi(sn), cM0, G2, ER+, PR+, HER2-) - Unsigned    Prior Therapy: None  Current Therapy:  Anastrozole   HISTORY OF PRESENT ILLNESS:   Oncology History   No history exists.     INTERVAL HISTORY:   Susan Beck is a 89 y.o. female seen for follow-up of her left breast cancer. He was last seen by me on 10/27/22.  Since her last visit, she underwent right breast MM on 04/26/23 that found: no mammographic evidence of malignancy.   Today, he states that he is doing well overall.  His appetite level is at 75%. His energy level is at 50%.   She denies ay issues with Anastrozole, including hot flashes. She denies any aches or new pains. She needs a refill of her Anastrozole.   PAST MEDICAL HISTORY:   Past Medical History: Past Medical History:  Diagnosis Date   Bowel obstruction (HCC)    Essential (primary) hypertension    Gastro-esophageal reflux disease with esophagitis    Nontoxic goiter, unspecified    Other fatigue     Shortness of breath     Surgical History: Past Surgical History:  Procedure Laterality Date   APPENDECTOMY     CATARACT EXTRACTION, BILATERAL     HERNIA REPAIR     MASTECTOMY MODIFIED RADICAL Left 03/25/2020   Procedure: MASTECTOMY MODIFIED RADICAL;  Surgeon: Franky Macho, MD;  Location: [facility];  Service: General;  Laterality: Left;   SMALL INTESTINE SURGERY     THYROIDECTOMY      Social History: Social History   Socioeconomic History   Marital status: Divorced    Spouse name: Not on file   Number of children: Not on file   Years of education: Not on file   Highest education level: Not on file  Occupational History   Occupation: retired  Tobacco Use   Smoking status: Former    Current packs/day: 0.00    Average packs/day: 1 pack/day for 45.0 years (45.0 ttl pk-yrs)    Types: Cigarettes    Start date: 07/15/1954    Quit date: 07/16/1999    Years since quitting: 23.8   Smokeless tobacco: Never  Vaping Use   Vaping status: Never Used  Substance and Sexual Activity   Alcohol use: Not Currently    Alcohol/week: 0.0 standard drinks of alcohol   Drug use: Not Currently   Sexual activity: Not Currently  Other Topics Concern   Not on file  Social History Narrative   Not on file   Social Determinants of Health   Financial Resource Strain: Low Risk  (04/05/2020)   Overall Financial Resource Strain (CARDIA)    Difficulty of Paying Living Expenses: Not hard at all  Food Insecurity: No Food Insecurity (04/05/2020)   Hunger Vital Sign    Worried About Running Out of Food in the Last Year: Never true    Ran Out of Food in the Last Year: Never true  Transportation Needs: No Transportation Needs (04/05/2020)   PRAPARE - Administrator, Civil Service (Medical): No    Lack of Transportation (Non-Medical): No  Physical Activity: Insufficiently Active (04/05/2020)   Exercise Vital Sign    Days of Exercise per Week: 2 days    Minutes of Exercise per Session: 20 min   Stress: No Stress Concern Present (04/05/2020)   [employer] of Occupational Health - Occupational Stress Questionnaire    Feeling of Stress : Only a little  Social Connections: Moderately Isolated (04/05/2020)   Social Connection and Isolation Panel [NHANES]    Frequency of Communication with Friends and Family: More than three times a week    Frequency of Social Gatherings with Friends and Family: More than three times a week    Attends Religious Services: More than 4 times per year    Active Member of [employer] or Organizations: No    Attends Banker Meetings: Never    Marital Status: Divorced  Catering manager Violence: Not At Risk (04/05/2020)   Humiliation, Afraid, Rape, and Kick questionnaire    Fear of Current or  Ex-Partner: No    Emotionally Abused: No    Physically Abused: No    Sexually Abused: No    Family History: Family History  Problem Relation Age of Onset   Breast cancer Sister    Heart disease Paternal Grandmother     Current Medications:  Current Outpatient Medications:    amLODipine (NORVASC) 10 MG tablet, Take 1 tablet (10 mg total) by mouth daily., Disp: 90 tablet, Rfl: 3   Calcium Carbonate-Vitamin D (CALTRATE 600+D PO), Take by mouth daily., Disp: , Rfl:    chlorthalidone (HYGROTON) 25 MG tablet, Take 1 tablet (25 mg total) by mouth daily., Disp: 90 tablet, Rfl: 2   irbesartan (AVAPRO) 150 MG tablet, TAKE (1) TABLET BY MOUTH ONCE DAILY., Disp: 90 tablet, Rfl: 0   magnesium gluconate (MAGONATE) 500 MG tablet, Take 320 mg by mouth daily., Disp: , Rfl:    Multiple Vitamin (MULTIVITAMIN WITH MINERALS) TABS tablet, Take 1 tablet by mouth daily. Centrum Silver, Disp: , Rfl:    omeprazole (PRILOSEC) 20 MG capsule, Take 20 mg by mouth daily as needed., Disp: , Rfl:    potassium chloride SA (KLOR-CON M) 20 MEQ tablet, TAKE 2 TABLETS BY MOUTH DAILY., Disp: 180 tablet, Rfl: 2   anastrozole (ARIMIDEX) 1 MG tablet, Take 1 tablet (1 mg total) by mouth daily.,  Disp: 90 tablet, Rfl: 3   Allergies: No Known Allergies  REVIEW OF SYSTEMS:   Review of Systems  Constitutional:  Negative for chills, fatigue and fever.  HENT:   Negative for lump/mass, mouth sores, nosebleeds, sore throat and trouble swallowing.   Eyes:  Negative for eye problems.  Respiratory:  Positive for shortness of breath. Negative for cough.   Cardiovascular:  Negative for chest pain, leg swelling and palpitations.  Gastrointestinal:  Negative for abdominal pain, constipation, diarrhea, nausea and vomiting.  Genitourinary:  Negative for bladder incontinence, difficulty urinating, dysuria, frequency, hematuria and nocturia.   Musculoskeletal:  Negative for arthralgias, back pain, flank pain, myalgias and neck pain.  Skin:  Negative for itching and rash.  Neurological:  Negative for dizziness, headaches and numbness.  Hematological:  Does not bruise/bleed easily.  Psychiatric/Behavioral:  Positive for sleep disturbance. Negative for depression and suicidal ideas. The patient is not nervous/anxious.   All other systems reviewed and are negative.    VITALS:   Blood pressure 131/62, pulse 72, temperature 98.2 F (36.8 C), temperature source Oral, resp. rate 16, weight 183 lb 12.8 oz (83.4 kg), SpO2 100%.  Wt Readings from Last 3 Encounters:  05/04/23 183 lb 12.8 oz (83.4 kg)  11/09/22 181 lb 6.4 oz (82.3 kg)  10/27/22 184 lb 8.4 oz (83.7 kg)    Body mass index is 30.59 kg/m.  Performance status (ECOG): 1 - Symptomatic but completely ambulatory  PHYSICAL EXAM:   Physical Exam Vitals and nursing note reviewed. Exam conducted with a chaperone present.  Constitutional:      Appearance: Normal appearance.  Cardiovascular:     Rate and Rhythm: Normal rate and regular rhythm.     Pulses: Normal pulses.     Heart sounds: Normal heart sounds.  Pulmonary:     Effort: Pulmonary effort is normal.     Breath sounds: Normal breath sounds.  Chest:     Comments: +left  mastectomy site WNL +right breast has no palpable masses nor adenopathy Abdominal:     Palpations: Abdomen is soft. There is no hepatomegaly, splenomegaly or mass.     Tenderness: There is no abdominal  tenderness.  Musculoskeletal:     Right lower leg: No edema.     Left lower leg: No edema.  Lymphadenopathy:     Cervical: No cervical adenopathy.     Right cervical: No superficial, deep or posterior cervical adenopathy.    Left cervical: No superficial, deep or posterior cervical adenopathy.     Upper Body:     Right upper body: No supraclavicular or axillary adenopathy.     Left upper body: No supraclavicular or axillary adenopathy.  Neurological:     General: No focal deficit present.     Mental Status: He is alert and oriented to person, place, and time.  Psychiatric:        Mood and Affect: Mood normal.        Behavior: Behavior normal.   Breast Exam Chaperone: Anne Fu, LPN   LABS:      Latest Ref Rng & Units 04/26/2023    1:09 PM 10/20/2022   11:09 AM 04/20/2022    1:26 PM  CBC  WBC 4.0 - 10.5 K/uL 7.0  6.5  7.0   Hemoglobin 12.0 - 15.0 g/dL 96.0  45.4  09.8   Hematocrit 36.0 - 46.0 % 40.6  40.8  42.6   Platelets 150 - 400 K/uL 328  323  308       Latest Ref Rng & Units 04/26/2023    1:09 PM 10/20/2022   11:09 AM 04/20/2022    1:26 PM  CMP  Glucose 70 - 99 mg/dL 119  147  829   BUN 8 - 23 mg/dL 18  16  17    Creatinine 0.44 - 1.00 mg/dL 5.62  1.30  8.65   Sodium 135 - 145 mmol/L 135  137  137   Potassium 3.5 - 5.1 mmol/L 3.8  3.9  3.8   Chloride 98 - 111 mmol/L 101  103  104   CO2 22 - 32 mmol/L 26  24  25    Calcium 8.9 - 10.3 mg/dL 9.6  9.9  78.4   Total Protein 6.5 - 8.1 g/dL 8.2  7.7  8.2   Total Bilirubin 0.3 - 1.2 mg/dL 0.6  0.8  0.5   Alkaline Phos 38 - 126 U/L 61  68  65   AST 15 - 41 U/L 21  23  21    ALT 0 - 44 U/L 25  23  24       No results found for: "CEA1", "CEA" / No results found for: "CEA1", "CEA" No results found for: "PSA1" No results  found for: "ONG295" No results found for: "CAN125"  No results found for: "TOTALPROTELP", "ALBUMINELP", "A1GS", "A2GS", "BETS", "BETA2SER", "GAMS", "MSPIKE", "SPEI" No results found for: "TIBC", "FERRITIN", "IRONPCTSAT" No results found for: "LDH"   STUDIES:   MM 3D SCREENING MAMMOGRAM UNILATERAL RIGHT BREAST  Result Date: 04/28/2023 CLINICAL DATA:  Screening. EXAM: DIGITAL SCREENING UNILATERAL RIGHT MAMMOGRAM WITH CAD AND TOMOSYNTHESIS TECHNIQUE: Right screening digital craniocaudal and mediolateral oblique mammograms were obtained. Right screening digital breast tomosynthesis was performed. The images were evaluated with computer-aided detection. COMPARISON:  Previous exam(s). ACR Breast Density Category c: The breasts are heterogeneously dense, which may obscure small masses. FINDINGS: There are no findings suspicious for malignancy. IMPRESSION: No mammographic evidence of malignancy. A result letter of this screening mammogram will be mailed directly to the patient. RECOMMENDATION: Screening mammogram in one year. (Code:SM-B-01Y) BI-RADS CATEGORY  1: Negative. Electronically Signed   By: Amie Portland M.D.   On: 04/28/2023 08:23

## 2023-05-04 NOTE — Patient Instructions (Addendum)
[facility]  Discharge Instructions  You were seen and examined today by Dr. Ellin Saba.  Dr. Ellin Saba discussed your most recent lab work which revealed that everything looks good.  Continue taking the Anastrozole as prescribed.  Follow-up as scheduled in 6 months.    Thank you for choosing [facility] to provide your oncology and hematology care.   To afford each patient quality time with our provider, please arrive at least 15 minutes before your scheduled appointment time. You may need to reschedule your appointment if you arrive late (10 or more minutes). Arriving late affects you and other patients whose appointments are after yours.  Also, if you miss three or more appointments without notifying the office, you may be dismissed from the clinic at the provider's discretion.    Again, thank you for choosing [facility].  Our hope is that these requests will decrease the amount of time that you wait before being seen by our physicians.   If you have a lab appointment with the Cancer Center - please note that after April 8th, all labs will be drawn in the cancer center.  You do not have to check in or register with the main entrance as you have in the past but will complete your check-in at the cancer center.            _____________________________________________________________  Should you have questions after your visit to [facility], please contact our office at [phone] and follow the prompts.  Our office hours are 8:00 a.m. to 4:30 p.m. Monday - Thursday and 8:00 a.m. to 2:30 p.m. Friday.  Please note that voicemails left after 4:00 p.m. may not be returned until the following business day.  We are closed weekends and all major holidays.  You do have access to a nurse 24-7, just call the main number to the clinic [phone] and do not press any options, hold on the line and a nurse will answer  the phone.    For prescription refill requests, have your pharmacy contact our office and allow 72 hours.    Masks are no longer required in the cancer centers. If you would like for your care team to wear a mask while they are taking care of you, please let them know. You may have one support person who is at least 87 years old accompany you for your appointments.

## 2023-05-12 ENCOUNTER — Encounter: Payer: Self-pay | Admitting: Cardiology

## 2023-05-12 ENCOUNTER — Ambulatory Visit: Payer: Medicare PPO | Attending: Cardiology | Admitting: Cardiology

## 2023-05-12 VITALS — BP 142/72 | HR 76 | Ht 65.0 in | Wt 183.4 lb

## 2023-05-12 DIAGNOSIS — R0602 Shortness of breath: Secondary | ICD-10-CM | POA: Diagnosis not present

## 2023-05-12 DIAGNOSIS — I10 Essential (primary) hypertension: Secondary | ICD-10-CM

## 2023-05-12 NOTE — Progress Notes (Signed)
Clinical Summary [name] is a 89 y.o.adult seen today for follow up of the following medical problems.    1. SOB -initial evaluation in 2016/2017 for reported SOB   - PFTs were normal.  -  12/2015 completed exercise nuclear stress modified Bruce protocol. She went 4 min and 2.3 METs, achieving her THR. No evidence of ischemia.  -07/2015  echo with LVEF 60-65%, no WMAs, grade I diastolic dysfunction.      - chronic SOB overall stable, remains still phsyically active.   2. HTN - home bp's 120-130s/50s-60s, HRs 60-70s - 04/2023 Cr 0.95 BUN 18    3. Breast cancer - has completed surgery, radiation.        Lives alone, remains independent in all ADLs     Past Medical History:  Diagnosis Date   Bowel obstruction (HCC)    Essential (primary) hypertension    Gastro-esophageal reflux disease with esophagitis    Nontoxic goiter, unspecified    Other fatigue    Shortness of breath      No Known Allergies   Current Outpatient Medications  Medication Sig Dispense Refill   amLODipine (NORVASC) 10 MG tablet Take 1 tablet (10 mg total) by mouth daily. 90 tablet 3   anastrozole (ARIMIDEX) 1 MG tablet Take 1 tablet (1 mg total) by mouth daily. 90 tablet 3   Calcium Carbonate-Vitamin D (CALTRATE 600+D PO) Take by mouth daily.     chlorthalidone (HYGROTON) 25 MG tablet Take 1 tablet (25 mg total) by mouth daily. 90 tablet 2   irbesartan (AVAPRO) 150 MG tablet TAKE (1) TABLET BY MOUTH ONCE DAILY. 90 tablet 0   magnesium gluconate (MAGONATE) 500 MG tablet Take 320 mg by mouth daily.     Multiple Vitamin (MULTIVITAMIN WITH MINERALS) TABS tablet Take 1 tablet by mouth daily. Centrum Silver     omeprazole (PRILOSEC) 20 MG capsule Take 20 mg by mouth daily as needed.     potassium chloride SA (KLOR-CON M) 20 MEQ tablet TAKE 2 TABLETS BY MOUTH DAILY. 180 tablet 2   No current facility-administered medications for this visit.     Past Surgical History:  Procedure Laterality  Date   APPENDECTOMY     CATARACT EXTRACTION, BILATERAL     HERNIA REPAIR     MASTECTOMY MODIFIED RADICAL Left 03/25/2020   Procedure: MASTECTOMY MODIFIED RADICAL;  Surgeon: Franky Macho, MD;  Location: [facility];  Service: General;  Laterality: Left;   SMALL INTESTINE SURGERY     THYROIDECTOMY       No Known Allergies    Family History  Problem Relation Age of Onset   Breast cancer Sister    Heart disease Paternal Grandmother      Social History Mr. Bach reports that he quit smoking about 23 years ago. His smoking use included cigarettes. He started smoking about 68 years ago. He has a 45 pack-year smoking history. He has never used smokeless tobacco. Mr. Whitefield reports that he does not currently use alcohol.   Review of Systems CONSTITUTIONAL: No weight loss, fever, chills, weakness or fatigue.  HEENT: Eyes: No visual loss, blurred vision, double vision or yellow sclerae.No hearing loss, sneezing, congestion, runny nose or sore throat.  SKIN: No rash or itching.  CARDIOVASCULAR: per hpi RESPIRATORY: No shortness of breath, cough or sputum.  GASTROINTESTINAL: No anorexia, nausea, vomiting or diarrhea. No abdominal pain or blood.  GENITOURINARY: No burning on urination, no polyuria NEUROLOGICAL: No headache, dizziness, syncope, paralysis, ataxia,  numbness or tingling in the extremities. No change in bowel or bladder control.  MUSCULOSKELETAL: No muscle, back pain, joint pain or stiffness.  LYMPHATICS: No enlarged nodes. No history of splenectomy.  PSYCHIATRIC: No history of depression or anxiety.  ENDOCRINOLOGIC: No reports of sweating, cold or heat intolerance. No polyuria or polydipsia.  Marland Kitchen   Physical Examination Today's Vitals   05/12/23 1332  BP: (!) 142/72  Pulse: 76  SpO2: 95%  Weight: 183 lb 6.4 oz (83.2 kg)  Height: 5\' 5"  (1.651 m)   Body mass index is 30.52 kg/m.  Gen: resting comfortably, no acute distress HEENT: no scleral icterus, pupils equal round  and reactive, no palptable cervical adenopathy,  CV: RRR, no mrg, no jvd Resp: Clear to auscultation bilaterally GI: abdomen is soft, non-tender, non-distended, normal bowel sounds, no hepatosplenomegaly MSK: extremities are warm, no edema.  Skin: warm, no rash Neuro:  no focal deficits Psych: appropriate affect   Diagnostic Studies  07/2015 echo Study Conclusions  - Left ventricle: The cavity size was normal. Wall thickness was   increased in a pattern of mild LVH. Systolic function was normal.   The estimated ejection fraction was in the range of 60% to 65%.   Wall motion was normal; there were no regional wall motion   abnormalities. Doppler parameters are consistent with abnormal   left ventricular relaxation (grade 1 diastolic dysfunction).   Indeterminate filling pressures. - Aortic valve: Aortic valve sclerosis without stenosis. Mildly   thickened, mildly calcified leaflets. - Left atrium: The atrium was mildly dilated. Volume/bsa, ES,   (1-plane Simpson&'s, A2C): 30 ml/m^2.   10/2015 PFTs Normal     12/2015 MPI Blood pressure demonstrated a hypertensive response to exercise. There was no ST segment deviation noted during stress. The study is normal. There are no perfusion defects. This is a low risk study. The left ventricular ejection fraction is hyperdynamic (>65%).     Assessment and Plan   1. SOB -chronic symptoms unchanged, extensive workup 2016/2017 that was benign - still remains active and independent, does her own housework, shopping etc. In the absence of significant change in symptoms would not repeat cardiac test.    2. HTN - elevated in clinic but extensive home catalog of bp's is at goal - continue current meds  EKG today shows NSR, no acute ischemic changes  F/u 6 months   Antoine Poche, M.D.

## 2023-05-12 NOTE — Patient Instructions (Signed)

## 2023-05-13 ENCOUNTER — Ambulatory Visit: Payer: Medicare PPO | Admitting: Internal Medicine

## 2023-05-18 ENCOUNTER — Telehealth: Payer: Self-pay | Admitting: Internal Medicine

## 2023-05-18 ENCOUNTER — Encounter: Payer: Self-pay | Admitting: Internal Medicine

## 2023-05-18 ENCOUNTER — Ambulatory Visit: Payer: Medicare PPO | Admitting: Internal Medicine

## 2023-05-18 VITALS — BP 135/48 | HR 83 | Ht 65.0 in | Wt 183.8 lb

## 2023-05-18 DIAGNOSIS — K2100 Gastro-esophageal reflux disease with esophagitis, without bleeding: Secondary | ICD-10-CM

## 2023-05-18 DIAGNOSIS — Z23 Encounter for immunization: Secondary | ICD-10-CM | POA: Diagnosis not present

## 2023-05-18 DIAGNOSIS — I10 Essential (primary) hypertension: Secondary | ICD-10-CM | POA: Diagnosis not present

## 2023-05-18 DIAGNOSIS — H9193 Unspecified hearing loss, bilateral: Secondary | ICD-10-CM | POA: Diagnosis not present

## 2023-05-18 DIAGNOSIS — M8580 Other specified disorders of bone density and structure, unspecified site: Secondary | ICD-10-CM | POA: Insufficient documentation

## 2023-05-18 MED ORDER — OMEPRAZOLE 20 MG PO CPDR: 20.0000 mg | DELAYED_RELEASE_CAPSULE | Freq: Every day | ORAL | 1 refills | Status: DC | PRN

## 2023-05-18 NOTE — Assessment & Plan Note (Signed)
Patient reports a history of moderate bilateral hearing loss.  She states that she has previously deferred further workup while undergoing treatment for breast cancer, but is now interested in reestablishing care with audiology.  She has contact information to call and schedule an appointment and will notify our office if any assistance is needed.

## 2023-05-18 NOTE — Progress Notes (Signed)
Established Patient Office Visit  Subjective   Patient ID: Susan Beck, adult    DOB: 01-Jun-1932  Age: 89 y.o. MRN: [id number]  Chief Complaint  Patient presents with   Hypertension    F/u   Hearing Problem    Diagnosed w/ moderate hearing loss.    Medication Refill    Omeprazole    Susan Beck returns to care today for routine follow-up.  She was last evaluated by me on 3/11 as a new patient presenting to establish care.  No medication changes were made at that time and 18-month follow-up was arranged.  In the interim, she has been evaluated by oncology and cardiology for follow-up earlier this month.  There have otherwise been no acute interval events.  Susan Beck reports feeling fairly well today.  She endorses chronic fatigue but is otherwise asymptomatic and has no acute concerns to discuss.  Past Medical History:  Diagnosis Date   Bowel obstruction (HCC)    Essential (primary) hypertension    Gastro-esophageal reflux disease with esophagitis    Nontoxic goiter, unspecified    Other fatigue    Shortness of breath    Past Surgical History:  Procedure Laterality Date   APPENDECTOMY     CATARACT EXTRACTION, BILATERAL     HERNIA REPAIR     MASTECTOMY MODIFIED RADICAL Left 03/25/2020   Procedure: MASTECTOMY MODIFIED RADICAL;  Surgeon: Franky Macho, MD;  Location: [facility];  Service: General;  Laterality: Left;   SMALL INTESTINE SURGERY     THYROIDECTOMY     Social History   Tobacco Use   Smoking status: Former    Current packs/day: 0.00    Average packs/day: 1 pack/day for 45.0 years (45.0 ttl pk-yrs)    Types: Cigarettes    Start date: 07/15/1954    Quit date: 07/16/1999    Years since quitting: 23.8   Smokeless tobacco: Never  Vaping Use   Vaping status: Never Used  Substance Use Topics   Alcohol use: Not Currently    Alcohol/week: 0.0 standard drinks of alcohol   Drug use: Not Currently   Family History  Problem Relation Age of Onset   Breast cancer  Sister    Heart disease Paternal Grandmother    No Known Allergies  Review of Systems  Constitutional:  Positive for malaise/fatigue. Negative for chills and fever.  HENT:  Negative for sore throat.   Respiratory:  Negative for cough and shortness of breath.   Cardiovascular:  Negative for chest pain, palpitations and leg swelling.  Gastrointestinal:  Negative for abdominal pain, blood in stool, constipation, diarrhea, nausea and vomiting.  Genitourinary:  Negative for dysuria and hematuria.  Musculoskeletal:  Negative for myalgias.  Skin:  Negative for itching and rash.  Neurological:  Negative for dizziness and headaches.  Psychiatric/Behavioral:  Negative for depression and suicidal ideas.      Objective:     BP (!) 135/48 (BP Location: Right Arm, Patient Position: Sitting, Cuff Size: Large)   Pulse 83   Ht 5\' 5"  (1.651 m)   Wt 183 lb 12.8 oz (83.4 kg)   SpO2 97%   BMI 30.59 kg/m  BP Readings from Last 3 Encounters:  05/18/23 (!) 135/48  05/12/23 (!) 142/72  05/04/23 131/62   Physical Exam Vitals reviewed.  Constitutional:      General: He is not in acute distress.    Appearance: Normal appearance. He is not toxic-appearing.  HENT:     Head: Normocephalic and atraumatic.  Right Ear: External ear normal.     Left Ear: External ear normal.     Nose: Nose normal. No congestion or rhinorrhea.     Mouth/Throat:     Mouth: Mucous membranes are moist.     Pharynx: Oropharynx is clear. No oropharyngeal exudate or posterior oropharyngeal erythema.  Eyes:     General: No scleral icterus.    Extraocular Movements: Extraocular movements intact.     Conjunctiva/sclera: Conjunctivae normal.     Pupils: Pupils are equal, round, and reactive to light.  Cardiovascular:     Rate and Rhythm: Normal rate and regular rhythm.     Pulses: Normal pulses.     Heart sounds: Murmur heard.     No friction rub. No gallop.  Pulmonary:     Effort: Pulmonary effort is normal.      Breath sounds: Normal breath sounds. No wheezing, rhonchi or rales.  Abdominal:     General: Abdomen is flat. Bowel sounds are normal. There is no distension.     Palpations: Abdomen is soft.     Tenderness: There is no abdominal tenderness.     Hernia: A hernia (Reducible umbilical hernia) is present.  Musculoskeletal:        General: No swelling. Normal range of motion.     Cervical back: Normal range of motion.     Right lower leg: No edema.     Left lower leg: No edema.  Lymphadenopathy:     Cervical: No cervical adenopathy.  Skin:    General: Skin is warm and dry.     Capillary Refill: Capillary refill takes less than 2 seconds.     Coloration: Skin is not jaundiced.  Neurological:     General: No focal deficit present.     Mental Status: He is alert and oriented to person, place, and time.  Psychiatric:        Mood and Affect: Mood normal.        Behavior: Behavior normal.   Last CBC Lab Results  Component Value Date   WBC 7.0 04/26/2023   HGB 13.7 04/26/2023   HCT 40.6 04/26/2023   MCV 89.6 04/26/2023   MCH 30.2 04/26/2023   RDW 14.3 04/26/2023   PLT 328 04/26/2023   Last metabolic panel Lab Results  Component Value Date   GLUCOSE 111 (H) 04/26/2023   NA 135 04/26/2023   K 3.8 04/26/2023   CL 101 04/26/2023   CO2 26 04/26/2023   BUN 18 04/26/2023   CREATININE 0.95 04/26/2023   GFRNONAA 58 (L) 04/26/2023   CALCIUM 9.6 04/26/2023   PHOS 4.1 03/26/2020   PROT 8.2 (H) 04/26/2023   ALBUMIN 4.1 04/26/2023   BILITOT 0.6 04/26/2023   ALKPHOS 61 04/26/2023   AST 21 04/26/2023   ALT 25 04/26/2023   ANIONGAP 8 04/26/2023   Last lipids Lab Results  Component Value Date   CHOL 190 01/17/2019   HDL 46 01/17/2019   LDLCALC 105 01/17/2019   TRIG 94 01/17/2019   Last thyroid functions Lab Results  Component Value Date   TSH 1.494 01/12/2022   Last vitamin D Lab Results  Component Value Date   VD25OH 48.29 04/26/2023     Assessment & Plan:   Problem  List Items Addressed This Visit       Essential (primary) hypertension    Remains adequately controlled on current antihypertensive regimen.  No medication changes are indicated today.      Gastro-esophageal reflux disease with esophagitis  Symptoms remain adequately controlled with omeprazole 20 mg daily as needed.  Refill provided today.      Bilateral hearing loss    Patient reports a history of moderate bilateral hearing loss.  She states that she has previously deferred further workup while undergoing treatment for breast cancer, but is now interested in reestablishing care with audiology.  She has contact information to call and schedule an appointment and will notify our office if any assistance is needed.      Osteopenia    T-score -2.4 on DEXA from August 2024.  She is currently on vitamin D and calcium supplementation.  Prolia was discussed with oncology at her recent appointment but has been declined for now.      Need for influenza vaccination    Influenza vaccine administered today      Return in about 6 months (around 11/15/2023).   Billie Lade, MD

## 2023-05-18 NOTE — Telephone Encounter (Signed)
Placard   Copied Noted Sleeved (put in provider box)  Call  patient when ready

## 2023-05-18 NOTE — Assessment & Plan Note (Signed)
T-score -2.4 on DEXA from August 2024.  She is currently on vitamin D and calcium supplementation.  Prolia was discussed with oncology at her recent appointment but has been declined for now.

## 2023-05-18 NOTE — Assessment & Plan Note (Signed)
Remains adequately controlled on current antihypertensive regimen.  No medication changes are indicated today.

## 2023-05-18 NOTE — Patient Instructions (Signed)
It was a pleasure to see you today.  Thank you for giving [place] the opportunity to be involved in your care.  Below is a brief recap of your visit and next steps.  We will plan to see you again in 6 months.  Summary No medication changes today. Omeprazole refilled Follow up in 6 months

## 2023-05-18 NOTE — Assessment & Plan Note (Signed)
Symptoms remain adequately controlled with omeprazole 20 mg daily as needed.  Refill provided today.

## 2023-05-18 NOTE — Assessment & Plan Note (Signed)
Influenza vaccine administered today.

## 2023-05-19 NOTE — Telephone Encounter (Signed)
Called patient that form ready for pick up

## 2023-05-20 ENCOUNTER — Other Ambulatory Visit: Payer: Self-pay | Admitting: *Deleted

## 2023-05-20 ENCOUNTER — Other Ambulatory Visit: Payer: Self-pay | Admitting: Hematology

## 2023-05-20 DIAGNOSIS — C50912 Malignant neoplasm of unspecified site of left female breast: Secondary | ICD-10-CM

## 2023-05-20 NOTE — Telephone Encounter (Signed)
Anastrozole refill approved.  Patient is tolerating and is to continue therapy.

## 2023-06-01 ENCOUNTER — Other Ambulatory Visit: Payer: Self-pay | Admitting: Cardiology

## 2023-08-23 ENCOUNTER — Other Ambulatory Visit: Payer: Self-pay | Admitting: *Deleted

## 2023-08-23 DIAGNOSIS — C50912 Malignant neoplasm of unspecified site of left female breast: Secondary | ICD-10-CM

## 2023-08-23 MED ORDER — ANASTROZOLE 1 MG PO TABS: 1.0000 mg | ORAL_TABLET | Freq: Every day | ORAL | 0 refills | Status: DC

## 2023-09-10 ENCOUNTER — Other Ambulatory Visit: Payer: Self-pay | Admitting: Physician Assistant

## 2023-11-02 ENCOUNTER — Inpatient Hospital Stay: Payer: Medicare PPO | Attending: Hematology

## 2023-11-02 DIAGNOSIS — Z803 Family history of malignant neoplasm of breast: Secondary | ICD-10-CM | POA: Diagnosis not present

## 2023-11-02 DIAGNOSIS — Z9012 Acquired absence of left breast and nipple: Secondary | ICD-10-CM | POA: Diagnosis not present

## 2023-11-02 DIAGNOSIS — Z79811 Long term (current) use of aromatase inhibitors: Secondary | ICD-10-CM | POA: Diagnosis not present

## 2023-11-02 DIAGNOSIS — M8580 Other specified disorders of bone density and structure, unspecified site: Secondary | ICD-10-CM | POA: Insufficient documentation

## 2023-11-02 DIAGNOSIS — C50912 Malignant neoplasm of unspecified site of left female breast: Secondary | ICD-10-CM | POA: Insufficient documentation

## 2023-11-02 DIAGNOSIS — I10 Essential (primary) hypertension: Secondary | ICD-10-CM | POA: Diagnosis not present

## 2023-11-02 DIAGNOSIS — R5383 Other fatigue: Secondary | ICD-10-CM | POA: Diagnosis not present

## 2023-11-02 DIAGNOSIS — Z79899 Other long term (current) drug therapy: Secondary | ICD-10-CM | POA: Diagnosis not present

## 2023-11-02 DIAGNOSIS — K219 Gastro-esophageal reflux disease without esophagitis: Secondary | ICD-10-CM | POA: Diagnosis not present

## 2023-11-02 DIAGNOSIS — Z87891 Personal history of nicotine dependence: Secondary | ICD-10-CM | POA: Diagnosis not present

## 2023-11-02 DIAGNOSIS — Z170 Estrogen receptor positive status [ER+]: Secondary | ICD-10-CM | POA: Diagnosis not present

## 2023-11-02 LAB — COMPREHENSIVE METABOLIC PANEL
ALT: 22 U/L (ref 0–44)
AST: 19 U/L (ref 15–41)
Albumin: 3.9 g/dL (ref 3.5–5.0)
Alkaline Phosphatase: 63 U/L (ref 38–126)
Anion gap: 9 (ref 5–15)
BUN: 16 mg/dL (ref 8–23)
CO2: 28 mmol/L (ref 22–32)
Calcium: 10.1 mg/dL (ref 8.9–10.3)
Chloride: 100 mmol/L (ref 98–111)
Creatinine, Ser: 0.88 mg/dL (ref 0.44–1.00)
GFR, Estimated: >60 mL/min (ref >60)
Glucose, Bld: 110 mg/dL — ABNORMAL HIGH (ref 70–99)
Potassium: 3.6 mmol/L (ref 3.5–5.1)
Sodium: 137 mmol/L (ref 135–145)
Total Bilirubin: 0.6 mg/dL (ref 0.0–1.2)
Total Protein: 8.1 g/dL (ref 6.5–8.1)

## 2023-11-02 LAB — CBC WITH DIFFERENTIAL/PLATELET
Abs Immature Granulocytes: 0.03 10*3/uL (ref 0.00–0.07)
Basophils Absolute: 0.1 10*3/uL (ref 0.0–0.1)
Basophils Relative: 1 %
Eosinophils Absolute: 0 10*3/uL (ref 0.0–0.5)
Eosinophils Relative: 0 %
HCT: 41.4 % (ref 36.0–46.0)
Hemoglobin: 14.6 g/dL (ref 12.0–15.0)
Immature Granulocytes: 0 %
Lymphocytes Relative: 31 %
Lymphs Abs: 2.3 10*3/uL (ref 0.7–4.0)
MCH: 31.1 pg (ref 26.0–34.0)
MCHC: 35.3 g/dL (ref 30.0–36.0)
MCV: 88.3 fL (ref 80.0–100.0)
Monocytes Absolute: 0.8 10*3/uL (ref 0.1–1.0)
Monocytes Relative: 11 %
Neutro Abs: 4.3 10*3/uL (ref 1.7–7.7)
Neutrophils Relative %: 57 %
Platelets: 375 10*3/uL (ref 150–400)
RBC: 4.69 MIL/uL (ref 3.87–5.11)
RDW: 14.5 % (ref 11.5–15.5)
WBC: 7.5 10*3/uL (ref 4.0–10.5)
nRBC: 0 % (ref 0.0–0.2)

## 2023-11-08 NOTE — Progress Notes (Unsigned)
 [facility] [address], [place] [address]   CLINIC:  Medical Oncology/Hematology  PCP:  Billie Lade, MD [address] / [place] [address] [phone]   REASON FOR VISIT:  Follow-up for stage II left breast invasive lobular carcinoma (ER/PR positive)  PRIOR THERAPY: - Left mastectomy (03/25/2020)  CURRENT THERAPY: Anastrozole (since 04/08/2020)  BRIEF ONCOLOGIC HISTORY:  CANCER STAGING: Cancer Staging  Lobular carcinoma of breast, left (HCC) Staging form: Breast, AJCC 8th Edition - Clinical stage from 04/08/2020: Stage IIA (cT2, cN11mi(sn), cM0, G2, ER+, PR+, HER2-) - Unsigned   INTERVAL HISTORY:   Susan Beck, a 89 y.o. adult, returns for routine follow-up of her ***. Susan Beck was last seen on {XX/XX/XXXX}.   At today's visit, she reports feeling ***.  She denies any recent hospitalizations, surgeries, or changes in her baseline health status.  *** She denies any symptoms of recurrence such as new lumps, bone pain, chest pain, dyspnea, or abdominal pain.  *** *** She has no new headaches, seizures, or focal neurologic deficits. *** No B symptoms such as fever, chills, night sweats, unintentional weight loss.  *** She continues to take *** ARIMIDEX:  Hot flashes, mood swings, weight gain, alopecia, and musculoskeletal pain  *** She reports ***% energy and ***% appetite.  *** She is maintaining stable weight at this time.  ASSESSMENT & PLAN:  1.  Stage II (PT2PN42mi ) left breast invasive lobular carcinoma - Left breast biopsy on 02/13/2020 shows multifocal invasive lobular carcinoma of 12:30 position and 2:30-3:00, ER 90% positive, PR 100% positive, Ki-67 5%, HER-2 negative. - Left mastectomy on 03/25/2020 with 4.5 cm multifocal invasive lobular carcinoma, grade 2, LCIS, 1 lymph node with micrometastasis, margins negative. - Genetic testing was recommended by Dr. Ellin Saba, but declined by patient. - Anastrozole started on 04/08/2020.   She is tolerating anastrozole well.  *** - Most recent right breast mammogram (04/26/2023): BI-RADS Category 1, negative. - Most recent labs (11/02/2023): Normal CBC/D, CMP unremarkable with normal LFTs and normal renal function. - Physical exam (11/09/2023): *** Left mastectomy site is within normal limits.  Right breast has no palpable masses.  No adenopathy. - No "red flag" symptoms per ROS.  *** - PLAN: Continue anastrozole. - Right breast mammogram due 04/26/2024 - Labs and RTC in 6 months ***  2.  Osteopenia - DEXA scan (04/18/2020) T-score -2.2 - DEXA scan (04/23/2022) T-score -2.4 - Patient has previously declined Prolia injections. - PLAN: We discussed again regarding Prolia *** - Continue calcium, vitamin D, and weightbearing exercises. - DEXA/bone density scan due 04/24/2024  3.  Social/family history: - Patient lives by herself at home and is retired Runner, broadcasting/film/video. - She smoked 1 pack/day for 30 years and quit in 2000. - Sister has breast cancer 9 years ago.  Maternal uncle had cancer unknown to the patient.  Another younger sister is diagnosed with breast cancer.  PLAN SUMMARY: >> Mammogram due *** >> DEXA/bone density scan due *** >> Labs in 6 months *** >> OFFICE visit in 6 months (after labs) ***   REVIEW OF SYSTEMS: ***  Review of Systems - Oncology  PHYSICAL EXAM:   Performance status (ECOG): {CHL ONC WE:[id number]} *** There were no vitals filed for this visit. Wt Readings from Last 3 Encounters:  05/18/23 183 lb 12.8 oz (83.4 kg)  05/12/23 183 lb 6.4 oz (83.2 kg)  05/04/23 183 lb 12.8 oz (83.4 kg)   Physical Exam   PAST  MEDICAL/SURGICAL HISTORY:  Past Medical History:  Diagnosis Date   Bowel obstruction (HCC)    Essential (primary) hypertension    Gastro-esophageal reflux disease with esophagitis    Nontoxic goiter, unspecified    Other fatigue    Shortness of breath    Past Surgical History:  Procedure Laterality Date   APPENDECTOMY     CATARACT  EXTRACTION, BILATERAL     HERNIA REPAIR     MASTECTOMY MODIFIED RADICAL Left 03/25/2020   Procedure: MASTECTOMY MODIFIED RADICAL;  Surgeon: Franky Macho, MD;  Location: [facility];  Service: General;  Laterality: Left;   SMALL INTESTINE SURGERY     THYROIDECTOMY      SOCIAL HISTORY:  Social History   Socioeconomic History   Marital status: Divorced    Spouse name: Not on file   Number of children: Not on file   Years of education: Not on file   Highest education level: Not on file  Occupational History   Occupation: retired  Tobacco Use   Smoking status: Former    Current packs/day: 0.00    Average packs/day: 1 pack/day for 45.0 years (45.0 ttl pk-yrs)    Types: Cigarettes    Start date: 07/15/1954    Quit date: 07/16/1999    Years since quitting: 24.3   Smokeless tobacco: Never  Vaping Use   Vaping status: Never Used  Substance and Sexual Activity   Alcohol use: Not Currently    Alcohol/week: 0.0 standard drinks of alcohol   Drug use: Not Currently   Sexual activity: Not Currently  Other Topics Concern   Not on file  Social History Narrative   Not on file   Social Drivers of Health   Financial Resource Strain: Low Risk  (04/05/2020)   Overall Financial Resource Strain (CARDIA)    Difficulty of Paying Living Expenses: Not hard at all  Food Insecurity: No Food Insecurity (04/05/2020)   Hunger Vital Sign    Worried About Running Out of Food in the Last Year: Never true    Ran Out of Food in the Last Year: Never true  Transportation Needs: No Transportation Needs (04/05/2020)   PRAPARE - Administrator, Civil Service (Medical): No    Lack of Transportation (Non-Medical): No  Physical Activity: Insufficiently Active (04/05/2020)   Exercise Vital Sign    Days of Exercise per Week: 2 days    Minutes of Exercise per Session: 20 min  Stress: No Stress Concern Present (04/05/2020)   [employer] of Occupational Health - Occupational Stress Questionnaire    Feeling  of Stress : Only a little  Social Connections: Moderately Isolated (04/05/2020)   Social Connection and Isolation Panel [NHANES]    Frequency of Communication with Friends and Family: More than three times a week    Frequency of Social Gatherings with Friends and Family: More than three times a week    Attends Religious Services: More than 4 times per year    Active Member of [employer] or Organizations: No    Attends Banker Meetings: Never    Marital Status: Divorced  Catering manager Violence: Not At Risk (04/05/2020)   Humiliation, Afraid, Rape, and Kick questionnaire    Fear of Current or Ex-Partner: No    Emotionally Abused: No    Physically Abused: No    Sexually Abused: No    FAMILY HISTORY:  Family History  Problem Relation Age of Onset   Breast cancer Sister    Heart disease Paternal Grandmother  CURRENT MEDICATIONS:  Current Outpatient Medications  Medication Sig Dispense Refill   amLODipine (NORVASC) 10 MG tablet Take 1 tablet (10 mg total) by mouth daily. 90 tablet 3   anastrozole (ARIMIDEX) 1 MG tablet Take 1 tablet (1 mg total) by mouth daily. 90 tablet 0   Calcium Carbonate-Vitamin D (CALTRATE 600+D PO) Take by mouth daily.     chlorthalidone (HYGROTON) 25 MG tablet Take 1 tablet (25 mg total) by mouth daily. 90 tablet 2   irbesartan (AVAPRO) 150 MG tablet TAKE (1) TABLET BY MOUTH ONCE DAILY. 90 tablet 3   magnesium gluconate (MAGONATE) 500 MG tablet Take 320 mg by mouth daily.     Multiple Vitamin (MULTIVITAMIN WITH MINERALS) TABS tablet Take 1 tablet by mouth daily. Centrum Silver     omeprazole (PRILOSEC) 20 MG capsule Take 1 capsule (20 mg total) by mouth daily as needed. 90 capsule 1   potassium chloride SA (KLOR-CON M) 20 MEQ tablet TAKE 2 TABLETS BY MOUTH DAILY. 180 tablet 2   No current facility-administered medications for this visit.    ALLERGIES:  No Known Allergies  LABORATORY DATA:  I have reviewed the labs as listed.     Latest Ref  Rng & Units 11/02/2023    9:35 AM 04/26/2023    1:09 PM 10/20/2022   11:09 AM  CBC  WBC 4.0 - 10.5 K/uL 7.5  7.0  6.5   Hemoglobin 12.0 - 15.0 g/dL 16.1  09.6  04.5   Hematocrit 36.0 - 46.0 % 41.4  40.6  40.8   Platelets 150 - 400 K/uL 375  328  323       Latest Ref Rng & Units 11/02/2023    9:35 AM 04/26/2023    1:09 PM 10/20/2022   11:09 AM  CMP  Glucose 70 - 99 mg/dL 409  811  914   BUN 8 - 23 mg/dL 16  18  16    Creatinine 0.44 - 1.00 mg/dL 7.82  9.56  2.13   Sodium 135 - 145 mmol/L 137  135  137   Potassium 3.5 - 5.1 mmol/L 3.6  3.8  3.9   Chloride 98 - 111 mmol/L 100  101  103   CO2 22 - 32 mmol/L 28  26  24    Calcium 8.9 - 10.3 mg/dL 08.6  9.6  9.9   Total Protein 6.5 - 8.1 g/dL 8.1  8.2  7.7   Total Bilirubin 0.0 - 1.2 mg/dL 0.6  0.6  0.8   Alkaline Phos 38 - 126 U/L 63  61  68   AST 15 - 41 U/L 19  21  23    ALT 0 - 44 U/L 22  25  23      DIAGNOSTIC IMAGING:  I have independently reviewed the scans and discussed with the patient. No results found.   WRAP UP:  All questions were answered. The patient knows to call the clinic with any problems, questions or concerns.  Medical decision making: ***  Time spent on visit: I spent {CHL ONC TIME VISIT - VHQIO:[id number]} counseling the patient face to face. The total time spent in the appointment was {CHL ONC TIME VISIT - KGMWN:[id number]} and more than 50% was on counseling.  Carnella Guadalajara, PA-C  ***

## 2023-11-09 ENCOUNTER — Inpatient Hospital Stay: Payer: Medicare PPO | Admitting: Physician Assistant

## 2023-11-09 ENCOUNTER — Other Ambulatory Visit: Payer: Self-pay

## 2023-11-09 VITALS — BP 144/69 | HR 77 | Temp 98.1°F | Resp 18 | Wt 177.0 lb

## 2023-11-09 DIAGNOSIS — C50912 Malignant neoplasm of unspecified site of left female breast: Secondary | ICD-10-CM

## 2023-11-09 DIAGNOSIS — R5383 Other fatigue: Secondary | ICD-10-CM | POA: Diagnosis not present

## 2023-11-09 DIAGNOSIS — M8580 Other specified disorders of bone density and structure, unspecified site: Secondary | ICD-10-CM | POA: Diagnosis not present

## 2023-11-09 DIAGNOSIS — Z79811 Long term (current) use of aromatase inhibitors: Secondary | ICD-10-CM | POA: Diagnosis not present

## 2023-11-09 DIAGNOSIS — I10 Essential (primary) hypertension: Secondary | ICD-10-CM | POA: Diagnosis not present

## 2023-11-09 DIAGNOSIS — Z87891 Personal history of nicotine dependence: Secondary | ICD-10-CM | POA: Diagnosis not present

## 2023-11-09 DIAGNOSIS — Z170 Estrogen receptor positive status [ER+]: Secondary | ICD-10-CM | POA: Diagnosis not present

## 2023-11-09 DIAGNOSIS — Z9012 Acquired absence of left breast and nipple: Secondary | ICD-10-CM | POA: Diagnosis not present

## 2023-11-09 DIAGNOSIS — K219 Gastro-esophageal reflux disease without esophagitis: Secondary | ICD-10-CM | POA: Diagnosis not present

## 2023-11-09 MED ORDER — OMEPRAZOLE 20 MG PO CPDR: 20.0000 mg | DELAYED_RELEASE_CAPSULE | Freq: Every day | ORAL | 1 refills | Status: DC | PRN

## 2023-11-09 MED ORDER — ANASTROZOLE 1 MG PO TABS: 1.0000 mg | ORAL_TABLET | Freq: Every day | ORAL | 2 refills | Status: DC

## 2023-11-09 MED ORDER — ANASTROZOLE 1 MG PO TABS
1.0000 mg | ORAL_TABLET | Freq: Every day | ORAL | 0 refills | Status: DC
Start: 2023-11-09 — End: 2023-11-09

## 2023-11-09 NOTE — Patient Instructions (Signed)
 [facility] at [facility] **VISIT SUMMARY & IMPORTANT INSTRUCTIONS **   You were seen today by Rojelio Brenner PA-C for your history of breast cancer.    HISTORY OF LEFT BREAST CANCER Your most recent labs, mammogram, and physical exam did not show any evidence of returning breast cancer at this time. Continue taking anastrozole (breast cancer pill) daily. We will check your next mammogram in August 2025.  OSTEOPENIA Your breast cancer pill can cause weakening in your bones. We will check another bone density scan in August 2025. At your next visit, we will consider starting you on Prolia injections to help improve your bone density. Please see the attached handout for important information regarding Prolia. Please make an appointment to see your dentist within the next few months.  Your dentist will need to write a note saying that it is okay for you to start taking Prolia.  FOLLOW-UP APPOINTMENT: 6 months  ** Thank you for trusting me with your healthcare!  I strive to provide all of my patients with quality care at each visit.  If you receive a survey for this visit, I would be so grateful to you for taking the time to provide feedback.  Thank you in advance!  ~ Atanacio Melnyk                   Dr. Doreatha Massed   &   Rojelio Brenner, PA-C   - - - - - - - - - - - - - - - - - -    Thank you for choosing [facility] at [facility] to provide your oncology and hematology care.  To afford each patient quality time with our provider, please arrive at least 15 minutes before your scheduled appointment time.   If you have a lab appointment with the Cancer Center please come in thru the Main Entrance and check in at the main information desk.  You need to re-schedule your appointment should you arrive 10 or more minutes late.  We strive to give you quality time with our providers, and arriving late affects you and other patients whose  appointments are after yours.  Also, if you no show three or more times for appointments you may be dismissed from the clinic at the providers discretion.     Again, thank you for choosing [facility].  Our hope is that these requests will decrease the amount of time that you wait before being seen by our physicians.       _____________________________________________________________  Should you have questions after your visit to [facility], please contact our office at [phone] and follow the prompts.  Our office hours are 8:00 a.m. and 4:30 p.m. Monday - Friday.  Please note that voicemails left after 4:00 p.m. may not be returned until the following business day.  We are closed weekends and major holidays.  You do have access to a nurse 24-7, just call the main number to the clinic [phone] and do not press any options, hold on the line and a nurse will answer the phone.    For prescription refill requests, have your pharmacy contact our office and allow 72 hours.

## 2023-11-15 ENCOUNTER — Encounter: Payer: Self-pay | Admitting: Internal Medicine

## 2023-11-15 ENCOUNTER — Ambulatory Visit: Payer: Medicare PPO | Admitting: Internal Medicine

## 2023-11-15 VITALS — BP 135/72 | HR 69 | Ht 65.0 in | Wt 178.2 lb

## 2023-11-15 DIAGNOSIS — M8580 Other specified disorders of bone density and structure, unspecified site: Secondary | ICD-10-CM

## 2023-11-15 DIAGNOSIS — K2100 Gastro-esophageal reflux disease with esophagitis, without bleeding: Secondary | ICD-10-CM | POA: Diagnosis not present

## 2023-11-15 DIAGNOSIS — C50912 Malignant neoplasm of unspecified site of left female breast: Secondary | ICD-10-CM | POA: Diagnosis not present

## 2023-11-15 DIAGNOSIS — I10 Essential (primary) hypertension: Secondary | ICD-10-CM | POA: Diagnosis not present

## 2023-11-15 NOTE — Assessment & Plan Note (Signed)
 Stable.  Seen by oncology for follow-up earlier this month.  She remains on anastrozole.

## 2023-11-15 NOTE — Progress Notes (Signed)
 Established Patient Office Visit  Subjective   Patient ID: Susan Beck, adult    DOB: [birth year]  Age: 89 y.o. MRN: [id number]  Chief Complaint  Patient presents with   Care Management   Susan Beck returns to care today for routine follow-up.  She was last evaluated by me in September 2024.  No medication changes were made at that time and 88-month follow-up was arranged.  In the interim, she was seen by oncology for follow-up earlier this month (3/11).  There have otherwise been no acute interval events. Susan Beck reports feeling well today. She is asymptomatic and has no acute concerns to discuss.   Past Medical History:  Diagnosis Date   Bowel obstruction (HCC)    Essential (primary) hypertension    Gastro-esophageal reflux disease with esophagitis    Nontoxic goiter, unspecified    Other fatigue    Shortness of breath    Past Surgical History:  Procedure Laterality Date   APPENDECTOMY     CATARACT EXTRACTION, BILATERAL     HERNIA REPAIR     MASTECTOMY MODIFIED RADICAL Left 03/25/2020   Procedure: MASTECTOMY MODIFIED RADICAL;  Surgeon: Franky Macho, MD;  Location: [facility];  Service: General;  Laterality: Left;   SMALL INTESTINE SURGERY     THYROIDECTOMY     Social History   Tobacco Use   Smoking status: Former    Current packs/day: 0.00    Average packs/day: 1 pack/day for 45.0 years (45.0 ttl pk-yrs)    Types: Cigarettes    Start date: 07/15/1954    Quit date: 07/16/1999    Years since quitting: 24.3   Smokeless tobacco: Never  Vaping Use   Vaping status: Never Used  Substance Use Topics   Alcohol use: Not Currently    Alcohol/week: 0.0 standard drinks of alcohol   Drug use: Not Currently   Family History  Problem Relation Age of Onset   Breast cancer Sister    Heart disease Paternal Grandmother    No Known Allergies  Review of Systems  Constitutional:  Negative for chills and fever.  HENT:  Negative for sore throat.   Respiratory:  Negative for  cough and shortness of breath.   Cardiovascular:  Negative for chest pain, palpitations and leg swelling.  Gastrointestinal:  Negative for abdominal pain, blood in stool, constipation, diarrhea, nausea and vomiting.  Genitourinary:  Negative for dysuria and hematuria.  Musculoskeletal:  Negative for myalgias.  Skin:  Negative for itching and rash.  Neurological:  Negative for dizziness and headaches.  Psychiatric/Behavioral:  Negative for depression and suicidal ideas.      Objective:     BP 135/72 (BP Location: Left Arm, Patient Position: Sitting, Cuff Size: Large)   Pulse 69   Ht 5\' 5"  (1.651 m)   Wt 178 lb 3.2 oz (80.8 kg)   SpO2 97%   BMI 29.65 kg/m  BP Readings from Last 3 Encounters:  11/15/23 135/72  11/09/23 (!) 144/69  05/18/23 (!) 135/48   Physical Exam Vitals reviewed.  Constitutional:      General: He is not in acute distress.    Appearance: Normal appearance. He is not toxic-appearing.  HENT:     Head: Normocephalic and atraumatic.     Right Ear: External ear normal.     Left Ear: External ear normal.     Nose: Nose normal. No congestion or rhinorrhea.     Mouth/Throat:     Mouth: Mucous membranes are moist.  Pharynx: Oropharynx is clear. No oropharyngeal exudate or posterior oropharyngeal erythema.  Eyes:     General: No scleral icterus.    Extraocular Movements: Extraocular movements intact.     Conjunctiva/sclera: Conjunctivae normal.     Pupils: Pupils are equal, round, and reactive to light.  Cardiovascular:     Rate and Rhythm: Normal rate and regular rhythm.     Pulses: Normal pulses.     Heart sounds: Murmur heard.     No friction rub. No gallop.  Pulmonary:     Effort: Pulmonary effort is normal.     Breath sounds: Normal breath sounds. No wheezing, rhonchi or rales.  Abdominal:     General: Abdomen is flat. Bowel sounds are normal. There is no distension.     Palpations: Abdomen is soft.     Tenderness: There is no abdominal tenderness.      Hernia: A hernia (Reducible umbilical hernia) is present.  Musculoskeletal:        General: No swelling. Normal range of motion.     Cervical back: Normal range of motion.     Right lower leg: No edema.     Left lower leg: No edema.  Lymphadenopathy:     Cervical: No cervical adenopathy.  Skin:    General: Skin is warm and dry.     Capillary Refill: Capillary refill takes less than 2 seconds.     Coloration: Skin is not jaundiced.  Neurological:     General: No focal deficit present.     Mental Status: He is alert and oriented to person, place, and time.  Psychiatric:        Mood and Affect: Mood normal.        Behavior: Behavior normal.   Last CBC Lab Results  Component Value Date   WBC 7.5 11/02/2023   HGB 14.6 11/02/2023   HCT 41.4 11/02/2023   MCV 88.3 11/02/2023   MCH 31.1 11/02/2023   RDW 14.5 11/02/2023   PLT 375 11/02/2023   Last metabolic panel Lab Results  Component Value Date   GLUCOSE 110 (H) 11/02/2023   NA 137 11/02/2023   K 3.6 11/02/2023   CL 100 11/02/2023   CO2 28 11/02/2023   BUN 16 11/02/2023   CREATININE 0.88 11/02/2023   GFRNONAA >60 11/02/2023   CALCIUM 10.1 11/02/2023   PHOS 4.1 03/26/2020   PROT 8.1 11/02/2023   ALBUMIN 3.9 11/02/2023   BILITOT 0.6 11/02/2023   ALKPHOS 63 11/02/2023   AST 19 11/02/2023   ALT 22 11/02/2023   ANIONGAP 9 11/02/2023   Last lipids Lab Results  Component Value Date   CHOL 190 01/17/2019   HDL 46 01/17/2019   LDLCALC 105 01/17/2019   TRIG 94 01/17/2019   Last thyroid functions Lab Results  Component Value Date   TSH 1.494 01/12/2022   Last vitamin D Lab Results  Component Value Date   VD25OH 48.29 04/26/2023     Assessment & Plan:   Problem List Items Addressed This Visit       Essential (primary) hypertension - Primary   Remains adequately controlled on current antihypertensive regimen.      Gastro-esophageal reflux disease with esophagitis   Symptoms remain well-controlled with  omeprazole.      Osteopenia   T-score -2.4 on DEXA from August 2024.  She remains on vitamin D and calcium supplementation.  Currently has been discussed with oncology but she is still unsure about proceeding with treatment.  She will repeat  DEXA in September prior to oncology follow-up.      Lobular carcinoma of breast, left (HCC)   Stable.  Seen by oncology for follow-up earlier this month.  She remains on anastrozole.      Return in about 6 months (around 05/17/2024).   Billie Lade, MD

## 2023-11-15 NOTE — Assessment & Plan Note (Signed)
Symptoms remain well-controlled with omeprazole. 

## 2023-11-15 NOTE — Assessment & Plan Note (Addendum)
 T-score -2.4 on DEXA from August 2024.  She remains on vitamin D and calcium supplementation.  Currently has been discussed with oncology but she is still unsure about proceeding with treatment.  She will repeat DEXA in September prior to oncology follow-up.

## 2023-11-15 NOTE — Patient Instructions (Signed)
 It was a pleasure to see you today.  Thank you for giving [place] the opportunity to be involved in your care.  Below is a brief recap of your visit and next steps.  We will plan to see you again in 6 months.  Summary No medication changes today We will plan for follow up in 6 months

## 2023-11-15 NOTE — Assessment & Plan Note (Signed)
 Remains adequately controlled on current antihypertensive regimen.

## 2023-12-13 NOTE — Progress Notes (Signed)
 Cardiology Office Note:  .   Date:  12/27/2023  ID:  Susan Beck, DOB May 10, 1932, MRN [id number] PCP: Tobi Fortes, MD  [facility] Providers Cardiologist:  Armida Lander, MD    History of Present Illness: .   Susan Beck is a 89 y.o. adult with history of SOB, normal PFT's, normal NST 2017, normal LVEF on echo 2016, HTN, breast CA s/p surgery and radiation.  Patient here alone. Lives independently and still drives. Not a lot of energy. Chronic DOE unchanged. She walked 15 min 3 days last week. Denies chest pain, palpitations, edema. BP at [id number]'[id number]. Was up this am when she was rushing around.   ROS:    Studies Reviewed: Aaron Aas         Prior CV Studies:    07/2015 echo Study Conclusions  - Left ventricle: The cavity size was normal. Wall thickness was   increased in a pattern of mild LVH. Systolic function was normal.   The estimated ejection fraction was in the range of 60% to 65%.   Wall motion was normal; there were no regional wall motion   abnormalities. Doppler parameters are consistent with abnormal   left ventricular relaxation (grade 1 diastolic dysfunction).   Indeterminate filling pressures. - Aortic valve: Aortic valve sclerosis without stenosis. Mildly   thickened, mildly calcified leaflets. - Left atrium: The atrium was mildly dilated. Volume/bsa, ES,   (1-plane Simpson&'s, A2C): 30 ml/m^2.   10/2015 PFTs Normal     12/2015 MPI Blood pressure demonstrated a hypertensive response to exercise. There was no ST segment deviation noted during stress. The study is normal. There are no perfusion defects. This is a low risk study. The left ventricular ejection fraction is hyperdynamic (>65%).    Risk Assessment/Calculations:     HYPERTENSION CONTROL Vitals:   12/27/23 1228 12/27/23 1239  BP: (!) 144/74 (!) 140/70    The patient's blood pressure is elevated above target today.  In order to address the patient's elevated BP: Blood  pressure will be monitored at home to determine if medication changes need to be made.; The blood pressure is usually elevated in clinic.  Blood pressures monitored at home have been optimal.          Physical Exam:   VS:  BP (!) 140/70   Pulse 74   Ht 5\' 5"  (1.651 m)   Wt 180 lb (81.6 kg)   SpO2 96%   BMI 29.95 kg/m    Wt Readings from Last 3 Encounters:  12/27/23 180 lb (81.6 kg)  11/15/23 178 lb 3.2 oz (80.8 kg)  11/09/23 177 lb 0.5 oz (80.3 kg)    GEN: Well nourished, well developed in no acute distress NECK: No JVD; No carotid bruits CARDIAC:  RRR, no murmurs, rubs, gallops RESPIRATORY:  Clear to auscultation without rales, wheezing or rhonchi  ABDOMEN: Soft, non-tender, non-distended EXTREMITIES:  No edema; No deformity   ASSESSMENT AND PLAN: .     Chronic SOB -chronic symptoms unchanged, extensive workup 2016/2017 that was benign - still remains active and independent, does her own housework, shopping, driving etc. In the absence of significant change in symptoms would not repeat cardiac test.      HTN - mildly elevated in clinic but checks twice a day at  home and  bp's at goal - continue current meds  -low sodium diet -blood work reviewed on [facility] from 10/2023 and stable, no changes       Dispo: f/u in  6 months  Signed, Theotis Flake, PA-C

## 2023-12-18 ENCOUNTER — Other Ambulatory Visit: Payer: Self-pay | Admitting: Cardiology

## 2023-12-27 ENCOUNTER — Ambulatory Visit: Payer: Medicare PPO | Attending: Physician Assistant | Admitting: Physician Assistant

## 2023-12-27 ENCOUNTER — Encounter: Payer: Self-pay | Admitting: Physician Assistant

## 2023-12-27 VITALS — BP 140/70 | HR 74 | Ht 65.0 in | Wt 180.0 lb

## 2023-12-27 DIAGNOSIS — I10 Essential (primary) hypertension: Secondary | ICD-10-CM

## 2023-12-27 DIAGNOSIS — R0602 Shortness of breath: Secondary | ICD-10-CM

## 2023-12-27 NOTE — Patient Instructions (Signed)
Medication Instructions:  Your physician recommends that you continue on your current medications as directed. Please refer to the Current Medication list given to you today.   Labwork: None today  Testing/Procedures: None today  Follow-Up: 6 months Dr.Branch  Any Other Special Instructions Will Be Listed Below (If Applicable).  If you need a refill on your cardiac medications before your next appointment, please call your pharmacy.

## 2024-04-14 ENCOUNTER — Encounter: Payer: Self-pay | Admitting: Family Medicine

## 2024-04-14 ENCOUNTER — Ambulatory Visit: Admitting: Family Medicine

## 2024-04-14 VITALS — BP 178/61 | HR 83 | Ht 65.0 in | Wt 178.0 lb

## 2024-04-14 DIAGNOSIS — E7849 Other hyperlipidemia: Secondary | ICD-10-CM

## 2024-04-14 DIAGNOSIS — E038 Other specified hypothyroidism: Secondary | ICD-10-CM | POA: Diagnosis not present

## 2024-04-14 DIAGNOSIS — I10 Essential (primary) hypertension: Secondary | ICD-10-CM

## 2024-04-14 DIAGNOSIS — R7301 Impaired fasting glucose: Secondary | ICD-10-CM

## 2024-04-14 DIAGNOSIS — K2100 Gastro-esophageal reflux disease with esophagitis, without bleeding: Secondary | ICD-10-CM

## 2024-04-14 DIAGNOSIS — E559 Vitamin D deficiency, unspecified: Secondary | ICD-10-CM

## 2024-04-14 NOTE — Patient Instructions (Addendum)
 I appreciate the opportunity to provide care to you today!    Follow up:  5 months  Fasting Labs: please stop by the lab during the week to get your blood drawn (CBC, CMP, TSH, Lipid profile, HgA1c, Vit D)  Schedule medicare annual wellness visit  For a Healthier YOU, I Recommend: Reducing your intake of sugar, sodium, carbohydrates, and saturated fats. Increasing your fiber intake by incorporating more whole grains, fruits, and vegetables into your meals. Setting healthy goals with a focus on lowering your consumption of carbs, sugar, and unhealthy fats. Adding variety to your diet by including a wide range of fruits and vegetables. Cutting back on soda and limiting processed foods as much as possible. Staying active: In addition to taking your weight loss medication, aim for at least 150 minutes of moderate-intensity physical activity each week for optimal results.   Please follow up if your symptoms worsen or fail to improve.   Please continue to a heart-healthy diet and increase your physical activities. Try to exercise for at least five days a week.    It was a pleasure to see you and I look forward to continuing to work together on your health and well-being. Please do not hesitate to call the office if you need care or have questions about your care.  In case of emergency, please visit the Emergency Department for urgent care, or contact our clinic at [phone] to schedule an appointment. We're here to help you!   Have a wonderful day and week. With Gratitude, Cassaundra Rasch MSN, FNP-BC

## 2024-04-14 NOTE — Assessment & Plan Note (Signed)
 The patient was noted to have uncontrolled blood pressure in the clinic today but is asymptomatic. She reports that her ambulatory blood pressure readings at home are well controlled. She is currently under the care of cardiology and has a follow-up appointment scheduled soon. The patient was encouraged to continue her current medication regimen as prescribed.  A low-sodium diet of less than 2,300 mg daily is recommended, along with moderate-intensity physical activity for at least 150 minutes per week. The patient is encouraged to maintain these lifestyle modifications to help manage her blood pressure effectively.  Long-term considerations were discussed, emphasizing that uncontrolled hypertension increases the risk of cardiovascular diseases, including stroke, coronary artery disease, and heart failure.  The patient is encouraged to seek emergency care if blood pressure exceeds 180/120 and is accompanied by symptoms such as headaches, chest pain, palpitations, blurred vision, or dizziness. She verbalized understanding and will follow up as scheduled.

## 2024-04-14 NOTE — Assessment & Plan Note (Addendum)
 Stable on omeprazole   For managing GERD, I recommend the following lifestyle changes:  Avoid Certain Foods and Drinks: Limit or eliminate coffee, chocolate, onions, peppermint, spicy foods, carbonated beverages, citrus fruits, tomatoes, garlic, alcohol , and fatty foods such as bacon, burgers, sausages, steak, fried foods, and dairy products.  Recommended Foods: Increase your intake of high-fiber foods including whole grain cereals, oatmeal, brown rice, root vegetables, and non-citrus fruits. Opt for high-protein foods and healthy fats such as avocados, olive oil, nuts, and seeds.

## 2024-04-14 NOTE — Progress Notes (Signed)
 Established Patient Office Visit  Subjective:  Patient ID: Susan Beck, adult    DOB: 31-Jul-1932  Age: 89 y.o. MRN: [id number]  CC:  Chief Complaint  Patient presents with   Medical Management of Chronic Issues    Follow up    HPI Susan Beck is a 89 y.o. adult with past medical history of  HTN, GERD, S/p left mastectomy and under the care of oncology presents for f/u of  chronic medical conditions.  For the details of today's visit, please refer to the assessment and plan.       Past Medical History:  Diagnosis Date   Bowel obstruction (HCC)    Essential (primary) hypertension    Gastro-esophageal reflux disease with esophagitis    Nontoxic goiter, unspecified    Other fatigue    Shortness of breath     Past Surgical History:  Procedure Laterality Date   APPENDECTOMY     CATARACT EXTRACTION, BILATERAL     HERNIA REPAIR     MASTECTOMY MODIFIED RADICAL Left 03/25/2020   Procedure: MASTECTOMY MODIFIED RADICAL;  Surgeon: Mavis Anes, MD;  Location: [facility];  Service: General;  Laterality: Left;   SMALL INTESTINE SURGERY     THYROIDECTOMY      Family History  Problem Relation Age of Onset   Breast cancer Sister    Heart disease Paternal Grandmother     Social History   Socioeconomic History   Marital status: Divorced    Spouse name: Not on file   Number of children: Not on file   Years of education: Not on file   Highest education level: Master's degree (e.g., MA, MS, MEng, MEd, MSW, MBA)  Occupational History   Occupation: retired  Tobacco Use   Smoking status: Former    Current packs/day: 0.00    Average packs/day: 1 pack/day for 45.0 years (45.0 ttl pk-yrs)    Types: Cigarettes    Start date: 07/15/1954    Quit date: 07/16/1999    Years since quitting: 24.7   Smokeless tobacco: Never  Vaping Use   Vaping status: Never Used  Substance and Sexual Activity   Alcohol  use: Not Currently    Alcohol /week: 0.0 standard drinks of alcohol    Drug use:  Not Currently   Sexual activity: Not Currently  Other Topics Concern   Not on file  Social History Narrative   Not on file   Social Drivers of Health   Financial Resource Strain: Medium Risk (04/11/2024)   Overall Financial Resource Strain (CARDIA)    Difficulty of Paying Living Expenses: Somewhat hard  Food Insecurity: No Food Insecurity (04/11/2024)   Hunger Vital Sign    Worried About Running Out of Food in the Last Year: Never true    Ran Out of Food in the Last Year: Never true  Transportation Needs: No Transportation Needs (04/11/2024)   PRAPARE - Administrator, Civil Service (Medical): No    Lack of Transportation (Non-Medical): No  Physical Activity: Insufficiently Active (04/11/2024)   Exercise Vital Sign    Days of Exercise per Week: 1 day    Minutes of Exercise per Session: 10 min  Stress: Stress Concern Present (04/11/2024)   [employer] of Occupational Health - Occupational Stress Questionnaire    Feeling of Stress: To some extent  Social Connections: Moderately Integrated (04/11/2024)   Social Connection and Isolation Panel    Frequency of Communication with Friends and Family: More than three times a week  Frequency of Social Gatherings with Friends and Family: Once a week    Attends Religious Services: More than 4 times per year    Active Member of [employer] or Organizations: Yes    Attends Engineer, structural: More than 4 times per year    Marital Status: Divorced  Intimate Partner Violence: Not At Risk (04/05/2020)   Humiliation, Afraid, Rape, and Kick questionnaire    Fear of Current or Ex-Partner: No    Emotionally Abused: No    Physically Abused: No    Sexually Abused: No    Outpatient Medications Prior to Visit  Medication Sig Dispense Refill   anastrozole  (ARIMIDEX ) 1 MG tablet Take 1 tablet (1 mg total) by mouth daily. 90 tablet 2   Calcium Carbonate-Vitamin D  (CALTRATE 600+D PO) Take by mouth daily.     chlorthalidone   (HYGROTON ) 25 MG tablet TAKE 1 TABLET BY MOUTH DAILY. 90 tablet 2   irbesartan  (AVAPRO ) 150 MG tablet TAKE (1) TABLET BY MOUTH ONCE DAILY. 90 tablet 3   Multiple Vitamin (MULTIVITAMIN WITH MINERALS) TABS tablet Take 1 tablet by mouth daily. Centrum Silver     omeprazole  (PRILOSEC) 20 MG capsule Take 1 capsule (20 mg total) by mouth daily as needed. 90 capsule 1   potassium chloride  SA (KLOR-CON  M) 20 MEQ tablet TAKE 2 TABLETS BY MOUTH DAILY. 180 tablet 2   No facility-administered medications prior to visit.    No Known Allergies  ROS Review of Systems  Constitutional:  Negative for chills and fever.  Eyes:  Negative for visual disturbance.  Respiratory:  Negative for chest tightness and shortness of breath.   Neurological:  Negative for dizziness and headaches.      Objective:    Physical Exam HENT:     Head: Normocephalic.     Mouth/Throat:     Mouth: Mucous membranes are moist.  Cardiovascular:     Rate and Rhythm: Normal rate.     Heart sounds: Normal heart sounds.  Pulmonary:     Effort: Pulmonary effort is normal.     Breath sounds: Normal breath sounds.  Neurological:     Mental Status: He is alert.     BP (!) 178/61   Pulse 83   Ht 5' 5 (1.651 m)   Wt 178 lb (80.7 kg)   SpO2 96%   BMI 29.62 kg/m  Wt Readings from Last 3 Encounters:  04/14/24 178 lb (80.7 kg)  12/27/23 180 lb (81.6 kg)  11/15/23 178 lb 3.2 oz (80.8 kg)    Lab Results  Component Value Date   TSH 1.494 01/12/2022   Lab Results  Component Value Date   WBC 7.5 11/02/2023   HGB 14.6 11/02/2023   HCT 41.4 11/02/2023   MCV 88.3 11/02/2023   PLT 375 11/02/2023   Lab Results  Component Value Date   NA 137 11/02/2023   K 3.6 11/02/2023   CO2 28 11/02/2023   GLUCOSE 110 (H) 11/02/2023   BUN 16 11/02/2023   CREATININE 0.88 11/02/2023   BILITOT 0.6 11/02/2023   ALKPHOS 63 11/02/2023   AST 19 11/02/2023   ALT 22 11/02/2023   PROT 8.1 11/02/2023   ALBUMIN 3.9 11/02/2023   CALCIUM  10.1 11/02/2023   ANIONGAP 9 11/02/2023   Lab Results  Component Value Date   CHOL 190 01/17/2019   Lab Results  Component Value Date   HDL 46 01/17/2019   Lab Results  Component Value Date   LDLCALC 105 01/17/2019  Lab Results  Component Value Date   TRIG 94 01/17/2019   No results found for: CHOLHDL No results found for: YHAJ8R    Assessment & Plan:  Essential (primary) hypertension Assessment & Plan: The patient was noted to have uncontrolled blood pressure in the clinic today but is asymptomatic. She reports that her ambulatory blood pressure readings at home are well controlled. She is currently under the care of cardiology and has a follow-up appointment scheduled soon. The patient was encouraged to continue her current medication regimen as prescribed.  A low-sodium diet of less than 2,300 mg daily is recommended, along with moderate-intensity physical activity for at least 150 minutes per week. The patient is encouraged to maintain these lifestyle modifications to help manage her blood pressure effectively.  Long-term considerations were discussed, emphasizing that uncontrolled hypertension increases the risk of cardiovascular diseases, including stroke, coronary artery disease, and heart failure.  The patient is encouraged to seek emergency care if blood pressure exceeds 180/120 and is accompanied by symptoms such as headaches, chest pain, palpitations, blurred vision, or dizziness. She verbalized understanding and will follow up as scheduled.    Gastroesophageal reflux disease with esophagitis, unspecified whether hemorrhage Assessment & Plan: Stable on omeprazole   For managing GERD, I recommend the following lifestyle changes:  Avoid Certain Foods and Drinks: Limit or eliminate coffee, chocolate, onions, peppermint, spicy foods, carbonated beverages, citrus fruits, tomatoes, garlic, alcohol , and fatty foods such as bacon, burgers, sausages, steak, fried foods,  and dairy products.  Recommended Foods: Increase your intake of high-fiber foods including whole grain cereals, oatmeal, brown rice, root vegetables, and non-citrus fruits. Opt for high-protein foods and healthy fats such as avocados, olive oil, nuts, and seeds.    IFG (impaired fasting glucose) -     Hemoglobin A1c  Vitamin D  deficiency -     VITAMIN D  25 Hydroxy (Vit-D Deficiency, Fractures)  TSH (thyroid -stimulating hormone deficiency) -     TSH + free T4  Other hyperlipidemia -     Lipid panel -     CMP14+EGFR -     CBC with Differential/Platelet  Note: This chart has been completed using Engineer, civil (consulting) software, and while attempts have been made to ensure accuracy, certain words and phrases may not be transcribed as intended.    Follow-up: Return in about 5 months (around 09/14/2024).   Heidee Audi, FNP

## 2024-04-17 DIAGNOSIS — E559 Vitamin D deficiency, unspecified: Secondary | ICD-10-CM | POA: Diagnosis not present

## 2024-04-17 DIAGNOSIS — R7301 Impaired fasting glucose: Secondary | ICD-10-CM | POA: Diagnosis not present

## 2024-04-17 DIAGNOSIS — E7849 Other hyperlipidemia: Secondary | ICD-10-CM | POA: Diagnosis not present

## 2024-04-17 DIAGNOSIS — E038 Other specified hypothyroidism: Secondary | ICD-10-CM | POA: Diagnosis not present

## 2024-04-18 ENCOUNTER — Ambulatory Visit: Payer: Self-pay | Admitting: Family Medicine

## 2024-04-18 DIAGNOSIS — E1165 Type 2 diabetes mellitus with hyperglycemia: Secondary | ICD-10-CM

## 2024-04-18 LAB — CBC WITH DIFFERENTIAL/PLATELET
Basophils Absolute: 0.1 x10E3/uL (ref 0.0–0.2)
Basos: 1 %
EOS (ABSOLUTE): 0 x10E3/uL (ref 0.0–0.4)
Eos: 0 %
Hematocrit: 42 % (ref 34.0–46.6)
Hemoglobin: 13.8 g/dL (ref 11.1–15.9)
Immature Grans (Abs): 0 x10E3/uL (ref 0.0–0.1)
Immature Granulocytes: 0 %
Lymphocytes Absolute: 2 x10E3/uL (ref 0.7–3.1)
Lymphs: 31 %
MCH: 29.1 pg (ref 26.6–33.0)
MCHC: 32.9 g/dL (ref 31.5–35.7)
MCV: 88 fL (ref 79–97)
Monocytes Absolute: 0.6 x10E3/uL (ref 0.1–0.9)
Monocytes: 9 %
Neutrophils Absolute: 3.9 x10E3/uL (ref 1.4–7.0)
Neutrophils: 59 %
Platelets: 310 x10E3/uL (ref 150–450)
RBC: 4.75 x10E6/uL (ref 3.77–5.28)
RDW: 14.3 % (ref 11.7–15.4)
WBC: 6.6 x10E3/uL (ref 3.4–10.8)

## 2024-04-18 LAB — HEMOGLOBIN A1C
Est. average glucose Bld gHb Est-mCnc: 143 mg/dL
Hgb A1c MFr Bld: 6.6 % — ABNORMAL HIGH (ref 4.8–5.6)

## 2024-04-18 LAB — CMP14+EGFR
ALT: 17 IU/L (ref 0–32)
AST: 15 IU/L (ref 0–40)
Albumin: 4 g/dL (ref 3.7–4.7)
Alkaline Phosphatase: 82 IU/L (ref 44–121)
BUN/Creatinine Ratio: 12 (ref 12–28)
BUN: 14 mg/dL (ref 8–27)
Bilirubin Total: 0.5 mg/dL (ref 0.0–1.2)
CO2: 22 mmol/L (ref 20–29)
Calcium: 10.3 mg/dL (ref 8.7–10.3)
Chloride: 102 mmol/L (ref 96–106)
Creatinine, Ser: 1.14 mg/dL — ABNORMAL HIGH (ref 0.57–1.00)
Globulin, Total: 3.3 g/dL (ref 1.5–4.5)
Glucose: 111 mg/dL — ABNORMAL HIGH (ref 70–99)
Potassium: 4.3 mmol/L (ref 3.5–5.2)
Sodium: 139 mmol/L (ref 134–144)
Total Protein: 7.3 g/dL (ref 6.0–8.5)
eGFR: 46 mL/min/1.73 — ABNORMAL LOW (ref >59)

## 2024-04-18 LAB — VITAMIN D 25 HYDROXY (VIT D DEFICIENCY, FRACTURES): Vit D, 25-Hydroxy: 43.3 ng/mL (ref 30.0–100.0)

## 2024-04-18 LAB — LIPID PANEL
Chol/HDL Ratio: 4.3 ratio (ref 0.0–4.4)
Cholesterol, Total: 159 mg/dL (ref 100–199)
HDL: 37 mg/dL — ABNORMAL LOW (ref >39)
LDL Chol Calc (NIH): 87 mg/dL (ref 0–99)
Triglycerides: 206 mg/dL — ABNORMAL HIGH (ref 0–149)
VLDL Cholesterol Cal: 35 mg/dL (ref 5–40)

## 2024-04-18 LAB — TSH+FREE T4
Free T4: 1.08 ng/dL (ref 0.82–1.77)
TSH: 1.64 u[IU]/mL (ref 0.450–4.500)

## 2024-04-18 MED ORDER — METFORMIN HCL 500 MG PO TABS: 500.0000 mg | ORAL_TABLET | Freq: Two times a day (BID) | ORAL | 3 refills | Status: DC

## 2024-04-18 MED ORDER — METFORMIN HCL 500 MG PO TABS: 500.0000 mg | ORAL_TABLET | Freq: Every day | ORAL | 3 refills | Status: AC

## 2024-04-26 ENCOUNTER — Encounter: Payer: Self-pay | Admitting: Cardiology

## 2024-04-26 ENCOUNTER — Telehealth: Payer: Self-pay | Admitting: Cardiology

## 2024-04-26 NOTE — Telephone Encounter (Signed)
 Pt brought a letter into the office for Dr. Alvan stating:   Re: Two Request  #1 I am asking that you authorize me to stop taking Chlorthalidone  and prescribe a replacement. The reason for my request is I am trying to lower my blood sugar level and this is one thing that may be contributing to its elevation (see attached pharmacy note).  This is just one of many steps I am taking to lower my blood sugar level.   If I am unable to lower my blood sugar level, I will have to start taking Melformin 500 mg tablets according to Dr. Zarwalo, my new Primary Care Physician.   #2 My last office visit with [facility] was with [name], PA on December 27, 2023. I should have scheduled a 6 month follow-up visit by now. I regret that I have neglected to do so. (I will be 89 years old next month and it is getting increasingly difficult to do all the things that's required of me). I am also dealing with a hearing loss. That's one of the reason I'm writing this memo instead of calling.   With that being said, please have your staff schedule my next six-month Office Visit with you, or your staff, sometime in October.  Thank you so much for your services.  Susan Beck     I have scheduled the pt for 06/08/2024 at 11 am w/Brittany Strader. I will make copies of the documents and send them to batch and give Dr. Alvan a copy as well. I have already scanned her BP log into her chart and routed it to Dr. Alvan.

## 2024-04-27 NOTE — Telephone Encounter (Signed)
 Ok to stop chlorthaildone, fairly modest effect on blood sugars but reasonable to stop. Is she still on norvasc , had been on her list for quite some time but no longer listed.   JINNY Ross MD

## 2024-04-27 NOTE — Telephone Encounter (Signed)
 I sent MD response to patient,await reply.

## 2024-05-03 ENCOUNTER — Ambulatory Visit (HOSPITAL_COMMUNITY)
Admission: RE | Admit: 2024-05-03 | Discharge: 2024-05-03 | Disposition: A | Discharge status: Home / Self Care | Source: Ambulatory Visit | Attending: Physician Assistant | Admitting: Physician Assistant

## 2024-05-03 ENCOUNTER — Encounter (HOSPITAL_COMMUNITY): Payer: Self-pay

## 2024-05-03 ENCOUNTER — Ambulatory Visit (HOSPITAL_COMMUNITY)
Admission: RE | Admit: 2024-05-03 | Discharge: 2024-05-03 | Disposition: A | Discharge status: Home / Self Care | Source: Ambulatory Visit | Attending: Physician Assistant

## 2024-05-03 ENCOUNTER — Inpatient Hospital Stay: Attending: Physician Assistant

## 2024-05-03 DIAGNOSIS — Z1721 Progesterone receptor positive status: Secondary | ICD-10-CM | POA: Insufficient documentation

## 2024-05-03 DIAGNOSIS — Z7984 Long term (current) use of oral hypoglycemic drugs: Secondary | ICD-10-CM | POA: Diagnosis not present

## 2024-05-03 DIAGNOSIS — Z79899 Other long term (current) drug therapy: Secondary | ICD-10-CM | POA: Insufficient documentation

## 2024-05-03 DIAGNOSIS — M810 Age-related osteoporosis without current pathological fracture: Secondary | ICD-10-CM | POA: Diagnosis not present

## 2024-05-03 DIAGNOSIS — Z1732 Human epidermal growth factor receptor 2 negative status: Secondary | ICD-10-CM | POA: Insufficient documentation

## 2024-05-03 DIAGNOSIS — Z87891 Personal history of nicotine dependence: Secondary | ICD-10-CM | POA: Diagnosis not present

## 2024-05-03 DIAGNOSIS — Z9011 Acquired absence of right breast and nipple: Secondary | ICD-10-CM | POA: Diagnosis not present

## 2024-05-03 DIAGNOSIS — Z170 Estrogen receptor positive status [ER+]: Secondary | ICD-10-CM | POA: Insufficient documentation

## 2024-05-03 DIAGNOSIS — R0609 Other forms of dyspnea: Secondary | ICD-10-CM | POA: Insufficient documentation

## 2024-05-03 DIAGNOSIS — Z79811 Long term (current) use of aromatase inhibitors: Secondary | ICD-10-CM | POA: Diagnosis not present

## 2024-05-03 DIAGNOSIS — C50912 Malignant neoplasm of unspecified site of left female breast: Secondary | ICD-10-CM | POA: Diagnosis not present

## 2024-05-03 DIAGNOSIS — Z9012 Acquired absence of left breast and nipple: Secondary | ICD-10-CM | POA: Insufficient documentation

## 2024-05-03 DIAGNOSIS — Z1231 Encounter for screening mammogram for malignant neoplasm of breast: Secondary | ICD-10-CM | POA: Insufficient documentation

## 2024-05-03 DIAGNOSIS — Z923 Personal history of irradiation: Secondary | ICD-10-CM | POA: Diagnosis not present

## 2024-05-03 DIAGNOSIS — K2100 Gastro-esophageal reflux disease with esophagitis, without bleeding: Secondary | ICD-10-CM | POA: Insufficient documentation

## 2024-05-03 DIAGNOSIS — Z803 Family history of malignant neoplasm of breast: Secondary | ICD-10-CM | POA: Diagnosis not present

## 2024-05-03 DIAGNOSIS — I10 Essential (primary) hypertension: Secondary | ICD-10-CM | POA: Diagnosis not present

## 2024-05-03 DIAGNOSIS — Z780 Asymptomatic menopausal state: Secondary | ICD-10-CM | POA: Diagnosis not present

## 2024-05-03 DIAGNOSIS — Z13820 Encounter for screening for osteoporosis: Secondary | ICD-10-CM | POA: Diagnosis not present

## 2024-05-03 DIAGNOSIS — M8580 Other specified disorders of bone density and structure, unspecified site: Secondary | ICD-10-CM

## 2024-05-03 LAB — CBC WITH DIFFERENTIAL/PLATELET
Abs Immature Granulocytes: 0.02 K/uL (ref 0.00–0.07)
Basophils Absolute: 0.1 K/uL (ref 0.0–0.1)
Basophils Relative: 1 %
Eosinophils Absolute: 0 K/uL (ref 0.0–0.5)
Eosinophils Relative: 0 %
HCT: 45.2 % (ref 36.0–46.0)
Hemoglobin: 14.6 g/dL (ref 12.0–15.0)
Immature Granulocytes: 0 %
Lymphocytes Relative: 40 %
Lymphs Abs: 2.5 K/uL (ref 0.7–4.0)
MCH: 29.2 pg (ref 26.0–34.0)
MCHC: 32.3 g/dL (ref 30.0–36.0)
MCV: 90.4 fL (ref 80.0–100.0)
Monocytes Absolute: 0.6 K/uL (ref 0.1–1.0)
Monocytes Relative: 10 %
Neutro Abs: 3 K/uL (ref 1.7–7.7)
Neutrophils Relative %: 49 %
Platelets: 284 K/uL (ref 150–400)
RBC: 5 MIL/uL (ref 3.87–5.11)
RDW: 14.9 % (ref 11.5–15.5)
WBC: 6.2 K/uL (ref 4.0–10.5)
nRBC: 0 % (ref 0.0–0.2)

## 2024-05-03 LAB — COMPREHENSIVE METABOLIC PANEL WITH GFR
ALT: 26 U/L (ref 0–44)
AST: 23 U/L (ref 15–41)
Albumin: 4 g/dL (ref 3.5–5.0)
Alkaline Phosphatase: 65 U/L (ref 38–126)
Anion gap: 9 (ref 5–15)
BUN: 15 mg/dL (ref 8–23)
CO2: 27 mmol/L (ref 22–32)
Calcium: 10.1 mg/dL (ref 8.9–10.3)
Chloride: 103 mmol/L (ref 98–111)
Creatinine, Ser: 1.1 mg/dL — ABNORMAL HIGH (ref 0.44–1.00)
GFR, Estimated: 48 mL/min — ABNORMAL LOW (ref >60)
Glucose, Bld: 108 mg/dL — ABNORMAL HIGH (ref 70–99)
Potassium: 4 mmol/L (ref 3.5–5.1)
Sodium: 139 mmol/L (ref 135–145)
Total Bilirubin: 1 mg/dL (ref 0.0–1.2)
Total Protein: 8.3 g/dL — ABNORMAL HIGH (ref 6.5–8.1)

## 2024-05-03 LAB — VITAMIN D 25 HYDROXY (VIT D DEFICIENCY, FRACTURES): Vit D, 25-Hydroxy: 47.72 ng/mL (ref 30–100)

## 2024-05-09 NOTE — Progress Notes (Unsigned)
 [facility] [address], [place] [address]   CLINIC:  Medical Oncology/Hematology  PCP:  Zarwolo, Gloria, FNP [address] / [place] [address] [phone]   REASON FOR VISIT:  Follow-up for stage II left breast invasive lobular carcinoma (ER/PR positive)  PRIOR THERAPY: - Left mastectomy (03/25/2020)  CURRENT THERAPY: Anastrozole  (since 04/08/2020)  BRIEF ONCOLOGIC HISTORY:  CANCER STAGING:  Cancer Staging  Lobular carcinoma of breast, left (HCC) Staging form: Breast, AJCC 8th Edition - Clinical stage from 04/08/2020: Stage IIA (cT2, cN43mi(sn), cM0, G2, ER+, PR+, HER2-) - Unsigned   INTERVAL HISTORY:   Susan Beck, a 89 y.o. female, returns for routine follow-up of her history of left-sided breast cancer. Susan Beck was last seen on 11/09/2023 by Pleasant Barefoot PA-C.   At today's visit, she reports feeling fairly well, although she is continuing to experience stress and grief as she processes the death of her sister from 11-07-2023.  She has not had any recent hospitalizations, surgeries, or changes in her baseline health status.  She denies any new breast lumps or axillary lymphadenopathy. No new onset bone pain, chest pain, or abdominal pain.    She has some occasional dyspnea on exertion. She has no new headaches, seizures, or focal neurologic deficits. No B symptoms such as fever, chills, night sweats, unintentional weight loss.  She continues to take Arimidex , which she is tolerating very well without hot flashes or other side effects.  She reports 70% energy and 75% appetite.   She has lost about 15 pounds in the past year, which she relates to the stress of her sister's recent death, as well as to dietary changes and trying to keep her sugar down.  ASSESSMENT & PLAN:  1.  Stage II (PT2PN39mi ) left breast invasive lobular carcinoma - Left breast biopsy on 02/13/2020 shows multifocal invasive lobular carcinoma of 12:30 position  and 2:30-3:00, ER 90% positive, PR 100% positive, Ki-67 5%, HER-2 negative. - Left mastectomy on 03/25/2020 with 4.5 cm multifocal invasive lobular carcinoma, grade 2, LCIS, 1 lymph node with micrometastasis, margins negative. - She completed XRT in [place], [place] - Genetic testing was recommended by Dr. Rogers, but declined by patient. - Anastrozole  started on 04/08/2020.  She is tolerating anastrozole  well.   - Most recent right breast mammogram (05/03/2024): BI-RADS Category 1, negative. - Most recent labs (05/03/2024): Normal CBC/D, normal LFTs. - Physical exam (05/10/24):  Left mastectomy site is within normal limits.  Right breast has no palpable masses.  No adenopathy. - No red flag symptoms per ROS.   - PLAN: Continue anastrozole .  We will send for BCI testing. - Labs and RTC in 6 months - Right breast mammogram due 05/04/2025 - Will continue to monitor weight loss, as discussed in HPI and consider additional workup if persistent or progressive  2.  Osteoporosis - DEXA scan (04/18/2020) T-score -2.2, osteopenia - DEXA scan (04/23/2022) T-score -2.4, osteopenia - DEXA/bone density (05/03/2024): T-score -2.6, osteoporosis - Patient has previously declined Prolia injections. - PLAN: We discussed again regarding Prolia.  She is very concerned about potential side effects, reports that her sister took Prolia and was hurting all the time.  Patient is more comfortable with the risk of osteoporosis fracture, then she is with the risk of Prolia side effects.  She continues to defer Prolia at this time, but is open to discussing it at future appointments. - Continue calcium, vitamin D , and weightbearing exercises. - Next DEXA/bone  density scan due September 2027  3.  Elevated blood pressure - Initial blood pressure was 190/71 - Repeat blood pressure 185/62 - Patient is asymptomatic; no headache, dizziness, blurry vision, strokelike symptoms, chest pain, or dyspnea  - Patient reports that home blood  pressure is usually SBP < 160 - Patient takes home blood pressure medications (irbesartan ), denies any missed doses  - Patient reports white coat hypertension, with frequently elevated BP at medical appointments - PLAN: Patient instructed to repeat blood pressure at home after resting for half an hour, and to contact PCP if blood pressure is greater than 160/90.  Instructed on alarm symptoms that would prompt 911 call and immediate medical attention.  4.  Social/family history: - Patient lives by herself at home and is retired Runner, broadcasting/film/video. - She smoked 1 pack/day for 30 years and quit in 2000. - Sister has breast cancer 9 years ago.  Maternal uncle had cancer unknown to the patient.  Another younger sister is diagnosed with breast cancer.  PLAN SUMMARY: >> Send BCI testing (via nurse navigator, Joesph Bohr, RN notified) >> Labs in 6 months = CBC/D, CMP, vitamin D  >> OFFICE visit in 6 months (after labs)    REVIEW OF SYSTEMS:   Review of Systems  Constitutional:  Positive for appetite change and unexpected weight change. Negative for chills, diaphoresis, fatigue and fever.  HENT:   Negative for lump/mass and nosebleeds.   Eyes:  Negative for eye problems.  Respiratory:  Positive for cough and shortness of breath (with exertion). Negative for hemoptysis.   Cardiovascular:  Negative for chest pain, leg swelling and palpitations.  Gastrointestinal:  Negative for abdominal pain, blood in stool, constipation, diarrhea, nausea and vomiting.  Genitourinary:  Negative for hematuria.   Skin: Negative.   Neurological:  Positive for light-headedness. Negative for dizziness and headaches.  Hematological:  Does not bruise/bleed easily.  Psychiatric/Behavioral:  Positive for sleep disturbance.     PHYSICAL EXAM:   Performance status (ECOG): 1 - Symptomatic but completely ambulatory  Vitals:   05/10/24 0927 05/10/24 0932  BP: (!) 190/71 (!) 185/62  Pulse: 83   Resp: 17   Temp: 98.3 F (36.8  C)   SpO2: 98%     Wt Readings from Last 3 Encounters:  05/10/24 166 lb (75.3 kg)  04/14/24 178 lb (80.7 kg)  12/27/23 180 lb (81.6 kg)   Physical Exam Constitutional:      Appearance: Normal appearance. He is obese.  Cardiovascular:     Heart sounds: Normal heart sounds.  Pulmonary:     Breath sounds: Normal breath sounds.  Chest:  Breasts:    Right: Normal.     Left: Absent.     Comments: Left mastectomy site is within normal limits.  Right breast has no palpable masses.  No adenopathy. Neurological:     General: No focal deficit present.     Mental Status: Mental status is at baseline.  Psychiatric:        Behavior: Behavior normal. Behavior is cooperative.      PAST MEDICAL/SURGICAL HISTORY:  Past Medical History:  Diagnosis Date   Bowel obstruction (HCC)    Essential (primary) hypertension    Gastro-esophageal reflux disease with esophagitis    Nontoxic goiter, unspecified    Other fatigue    Personal history of radiation therapy 2021   Shortness of breath    Past Surgical History:  Procedure Laterality Date   APPENDECTOMY     BREAST BIOPSY Left 02/13/2020   left  breast invasive lobular carcinoma   CATARACT EXTRACTION, BILATERAL     HERNIA REPAIR     MASTECTOMY Left 03/25/2020   MASTECTOMY MODIFIED RADICAL Left 03/25/2020   Procedure: MASTECTOMY MODIFIED RADICAL;  Surgeon: Mavis Anes, MD;  Location: [facility];  Service: General;  Laterality: Left;   SMALL INTESTINE SURGERY     THYROIDECTOMY      SOCIAL HISTORY:  Social History   Socioeconomic History   Marital status: Divorced    Spouse name: Not on file   Number of children: Not on file   Years of education: Not on file   Highest education level: Master's degree (e.g., MA, MS, MEng, MEd, MSW, MBA)  Occupational History   Occupation: retired  Tobacco Use   Smoking status: Former    Current packs/day: 0.00    Average packs/day: 1 pack/day for 45.0 years (45.0 ttl pk-yrs)    Types: Cigarettes     Start date: 07/15/1954    Quit date: 07/16/1999    Years since quitting: 24.8   Smokeless tobacco: Never  Vaping Use   Vaping status: Never Used  Substance and Sexual Activity   Alcohol  use: Not Currently    Alcohol /week: 0.0 standard drinks of alcohol    Drug use: Not Currently   Sexual activity: Not Currently  Other Topics Concern   Not on file  Social History Narrative   Not on file   Social Drivers of Health   Financial Resource Strain: Medium Risk (04/11/2024)   Overall Financial Resource Strain (CARDIA)    Difficulty of Paying Living Expenses: Somewhat hard  Food Insecurity: No Food Insecurity (04/11/2024)   Hunger Vital Sign    Worried About Running Out of Food in the Last Year: Never true    Ran Out of Food in the Last Year: Never true  Transportation Needs: No Transportation Needs (04/11/2024)   PRAPARE - Administrator, Civil Service (Medical): No    Lack of Transportation (Non-Medical): No  Physical Activity: Insufficiently Active (04/11/2024)   Exercise Vital Sign    Days of Exercise per Week: 1 day    Minutes of Exercise per Session: 10 min  Stress: Stress Concern Present (04/11/2024)   [employer] of Occupational Health - Occupational Stress Questionnaire    Feeling of Stress: To some extent  Social Connections: Moderately Integrated (04/11/2024)   Social Connection and Isolation Panel    Frequency of Communication with Friends and Family: More than three times a week    Frequency of Social Gatherings with Friends and Family: Once a week    Attends Religious Services: More than 4 times per year    Active Member of [employer] or Organizations: Yes    Attends Engineer, structural: More than 4 times per year    Marital Status: Divorced  Intimate Partner Violence: Not At Risk (04/05/2020)   Humiliation, Afraid, Rape, and Kick questionnaire    Fear of Current or Ex-Partner: No    Emotionally Abused: No    Physically Abused: No    Sexually  Abused: No    FAMILY HISTORY:  Family History  Problem Relation Age of Onset   Breast cancer Sister    Heart disease Paternal Grandmother     CURRENT MEDICATIONS:  Current Outpatient Medications  Medication Sig Dispense Refill   anastrozole  (ARIMIDEX ) 1 MG tablet Take 1 tablet (1 mg total) by mouth daily. 90 tablet 2   Calcium Carbonate-Vitamin D  (CALTRATE 600+D PO) Take by mouth daily.  irbesartan  (AVAPRO ) 150 MG tablet TAKE (1) TABLET BY MOUTH ONCE DAILY. 90 tablet 3   metFORMIN  (GLUCOPHAGE ) 500 MG tablet Take 1 tablet (500 mg total) by mouth daily. 90 tablet 3   Multiple Vitamin (MULTIVITAMIN WITH MINERALS) TABS tablet Take 1 tablet by mouth daily. Centrum Silver     omeprazole  (PRILOSEC) 20 MG capsule Take 1 capsule (20 mg total) by mouth daily as needed. 90 capsule 1   potassium chloride  SA (KLOR-CON  M) 20 MEQ tablet TAKE 2 TABLETS BY MOUTH DAILY. 180 tablet 2   No current facility-administered medications for this visit.    ALLERGIES:  No Known Allergies  LABORATORY DATA:  I have reviewed the labs as listed.     Latest Ref Rng & Units 05/03/2024    8:22 AM 04/17/2024   10:29 AM 11/02/2023    9:35 AM  CBC  WBC 4.0 - 10.5 K/uL 6.2  6.6  7.5   Hemoglobin 12.0 - 15.0 g/dL 85.3  86.1  85.3   Hematocrit 36.0 - 46.0 % 45.2  42.0  41.4   Platelets 150 - 400 K/uL 284  310  375       Latest Ref Rng & Units 05/03/2024    8:22 AM 04/17/2024   10:29 AM 11/02/2023    9:35 AM  CMP  Glucose 70 - 99 mg/dL 891  888  889   BUN 8 - 23 mg/dL 15  14  16    Creatinine 0.44 - 1.00 mg/dL 8.89  8.85  9.11   Sodium 135 - 145 mmol/L 139  139  137   Potassium 3.5 - 5.1 mmol/L 4.0  4.3  3.6   Chloride 98 - 111 mmol/L 103  102  100   CO2 22 - 32 mmol/L 27  22  28    Calcium 8.9 - 10.3 mg/dL 89.8  89.6  89.8   Total Protein 6.5 - 8.1 g/dL 8.3  7.3  8.1   Total Bilirubin 0.0 - 1.2 mg/dL 1.0  0.5  0.6   Alkaline Phos 38 - 126 U/L 65  82  63   AST 15 - 41 U/L 23  15  19    ALT 0 - 44 U/L 26  17   22      DIAGNOSTIC IMAGING:  I have independently reviewed the scans and discussed with the patient. MM 3D SCREENING MAMMOGRAM UNILATERAL RIGHT BREAST Result Date: 05/08/2024 CLINICAL DATA:  Screening. EXAM: DIGITAL SCREENING UNILATERAL RIGHT MAMMOGRAM WITH CAD AND TOMOSYNTHESIS TECHNIQUE: Right screening digital craniocaudal and mediolateral oblique mammograms were obtained. Right screening digital breast tomosynthesis was performed. The images were evaluated with computer-aided detection. COMPARISON:  Previous exam(s). ACR Breast Density Category c: The breasts are heterogeneously dense, which may obscure small masses. FINDINGS: The patient has had a left mastectomy. There are no findings suspicious for malignancy. IMPRESSION: No mammographic evidence of malignancy. A result letter of this screening mammogram will be mailed directly to the patient. RECOMMENDATION: Screening mammogram in one year.  (Code:SM-R-30M) BI-RADS CATEGORY  1: Negative. Electronically Signed   By: Inocente Ast M.D.   On: 05/08/2024 15:27   DG Bone Density Result Date: 05/03/2024 EXAM: DUAL X-RAY ABSORPTIOMETRY (DXA) FOR BONE MINERAL DENSITY 05/03/2024 9:25 am CLINICAL DATA:  89 year old Female Postmenopausal. Osteopenia secondary to cancer treatment TECHNIQUE: An axial (e.g., hips, spine) and/or appendicular (e.g., radius) exam was performed, as appropriate, using GE Psychologist, sport and exercise at [facility]. Images are obtained for bone mineral density measurement and are  not obtained for diagnostic purposes. [id number] Exclusions: None. COMPARISON:  04/23/2022 FINDINGS: Scan quality: Good. LUMBAR SPINE (L1-L4): BMD (in g/cm2): 1.028 T-score: -1.3 Z-score: 0.0 Rate of change from previous exam: No significant rate of change from previous exam. LEFT FEMORAL NECK: BMD (in g/cm2): 0.683 T-score: -2.6 Z-score: -0.9 LEFT TOTAL HIP: BMD (in g/cm2): 0.759 T-score: -2.0 Z-score: -0.4 RIGHT FEMORAL NECK: BMD (in g/cm2): 0.673  T-score: -2.6 Z-score: -0.9 RIGHT TOTAL HIP: BMD (in g/cm2): 0.705 T-score: -2.4 Z-score: -0.8 DUAL-FEMUR TOTAL MEAN: Rate of change from previous exam: -4.4 % FRAX 10-YEAR PROBABILITY OF FRACTURE: FRAX not reported as the lowest BMD is not in the osteopenia range. IMPRESSION: Osteoporosis based on BMD. Fracture risk is increased. Increased risk is based on low BMD. RECOMMENDATIONS: 1. All patients should optimize calcium and vitamin D  intake. 2. Consider FDA-approved medical therapies in postmenopausal women and men aged 78 years and older, based on the following: - A hip or vertebral (clinical or morphometric) fracture - T-score less than or equal to -2.5 and secondary causes have been excluded. - Low bone mass (T-score between -1.0 and -2.5) and a 10-year probability of a hip fracture greater than or equal to 3% or a 10-year probability of a major osteoporosis-related fracture greater than or equal to 20% based on the [place] -adapted WHO algorithm. - Clinician judgment and/or patient preferences may indicate treatment for people with 10-year fracture probabilities above or below these levels 3. Patients with diagnosis of osteoporosis or at high risk for fracture should have regular bone mineral density tests. For patients eligible for Medicare, routine testing is allowed once every 2 years. The testing frequency can be increased to one year for patients who have rapidly progressing disease, those who are receiving or discontinuing medical therapy to restore bone mass, or have additional risk factors. Electronically Signed   By: Dina  Arceo M.D.   On: 05/03/2024 14:12     WRAP UP:  All questions were answered. The patient knows to call the clinic with any problems, questions or concerns.  Medical decision making: Moderate  Time spent on visit: I spent 20 minutes counseling the patient face to face. The total time spent in the appointment was 30 minutes and more than 50% was on counseling.  Pleasant CHRISTELLA Barefoot, PA-C  05/10/24 11:17 AM

## 2024-05-10 ENCOUNTER — Inpatient Hospital Stay: Admitting: Physician Assistant

## 2024-05-10 VITALS — BP 185/62 | HR 83 | Temp 98.3°F | Resp 17 | Wt 166.0 lb

## 2024-05-10 DIAGNOSIS — M810 Age-related osteoporosis without current pathological fracture: Secondary | ICD-10-CM | POA: Diagnosis not present

## 2024-05-10 DIAGNOSIS — R0609 Other forms of dyspnea: Secondary | ICD-10-CM | POA: Diagnosis not present

## 2024-05-10 DIAGNOSIS — M8580 Other specified disorders of bone density and structure, unspecified site: Secondary | ICD-10-CM | POA: Diagnosis not present

## 2024-05-10 DIAGNOSIS — C50912 Malignant neoplasm of unspecified site of left female breast: Secondary | ICD-10-CM | POA: Diagnosis not present

## 2024-05-10 DIAGNOSIS — Z9011 Acquired absence of right breast and nipple: Secondary | ICD-10-CM | POA: Diagnosis not present

## 2024-05-10 DIAGNOSIS — Z1721 Progesterone receptor positive status: Secondary | ICD-10-CM | POA: Diagnosis not present

## 2024-05-10 DIAGNOSIS — Z79811 Long term (current) use of aromatase inhibitors: Secondary | ICD-10-CM | POA: Diagnosis not present

## 2024-05-10 DIAGNOSIS — I10 Essential (primary) hypertension: Secondary | ICD-10-CM | POA: Diagnosis not present

## 2024-05-10 DIAGNOSIS — Z1732 Human epidermal growth factor receptor 2 negative status: Secondary | ICD-10-CM | POA: Diagnosis not present

## 2024-05-10 DIAGNOSIS — Z170 Estrogen receptor positive status [ER+]: Secondary | ICD-10-CM | POA: Diagnosis not present

## 2024-05-10 MED ORDER — ANASTROZOLE 1 MG PO TABS: 1.0000 mg | ORAL_TABLET | Freq: Every day | ORAL | 2 refills | Status: AC

## 2024-05-10 NOTE — Patient Instructions (Signed)
 [facility] **VISIT SUMMARY & IMPORTANT INSTRUCTIONS **   You were seen today by Pleasant Barefoot PA-C for your history of breast cancer.    HISTORY OF LEFT BREAST CANCER Your most recent labs, mammogram, and physical exam did not show any evidence of returning breast cancer at this time. Continue taking anastrozole  (breast cancer pill) daily. We will check your next mammogram in September 2026.  OSTEOPOROSIS Your breast cancer pill can cause weakening in your bones. Your bone density scan from September 2025 shows your bones are continuing to get weaker and more fragile.  This is called osteoporosis. We discussed starting Prolia injections to help improve your bone density, but you have decided to hold off on this treatment for the time being. If you change your mind and would like to start Prolia, please let us  know.  FOLLOW-UP APPOINTMENT: 6 months   ** Thank you for trusting me with your healthcare!  I strive to provide all of my patients with quality care at each visit.  If you receive a survey for this visit, I would be so grateful to you for taking the time to provide feedback.  Thank you in advance!  ~ Virginie Josten                                              Dr. Mickiel Davonna Pleasant Barefoot, PA-C   Delon Hope, NP   - - - - - - - - - - - - - - - - - -     Thank you for choosing [facility] at [facility] to provide your oncology and hematology care.  To afford each patient quality time with our provider, please arrive at least 15 minutes before your scheduled appointment time.   If you have a lab appointment with the Cancer Center please come in thru the Main Entrance and check in at the main information desk.  You need to re-schedule your appointment should you arrive 10 or more minutes late.  We strive to give you quality time with our providers, and arriving late affects you and other patients whose  appointments are after yours.  Also, if you no show three or more times for appointments you may be dismissed from the clinic at the providers discretion.     Again, thank you for choosing [facility].  Our hope is that these requests will decrease the amount of time that you wait before being seen by our physicians.       _____________________________________________________________  Should you have questions after your visit to [facility], please contact our office at [phone] and follow the prompts.  Our office hours are 8:00 a.m. and 4:30 p.m. Monday - Friday.  Please note that voicemails left after 4:00 p.m. may not be returned until the following business day.  We are closed weekends and major holidays.  You do have access to a nurse 24-7, just call the main number to the clinic [phone] and do not press any options, hold on the line and a nurse will answer the phone.    For prescription refill requests, have your pharmacy contact our office and allow 72 hours.

## 2024-05-11 NOTE — Progress Notes (Signed)
 BCI testing requested on [id number] per Pleasant Barefoot, PA.

## 2024-05-20 DIAGNOSIS — C50412 Malignant neoplasm of upper-outer quadrant of left female breast: Secondary | ICD-10-CM | POA: Diagnosis not present

## 2024-05-20 DIAGNOSIS — Z170 Estrogen receptor positive status [ER+]: Secondary | ICD-10-CM | POA: Diagnosis not present

## 2024-05-22 ENCOUNTER — Encounter (HOSPITAL_COMMUNITY): Payer: Self-pay

## 2024-06-04 ENCOUNTER — Other Ambulatory Visit: Payer: Self-pay | Admitting: Cardiology

## 2024-06-06 ENCOUNTER — Ambulatory Visit: Payer: Self-pay | Admitting: Cardiology

## 2024-06-06 NOTE — Progress Notes (Unsigned)
 Cardiology Office Note    Date:  06/08/2024  ID:  Susan Beck, DOB 07-28-1932, MRN [id number] Cardiologist: Alvan Carrier, MD { :  History of Present Illness:    Susan Beck is a 89 y.o. adult with past medical history of breast cancer and shortness of breath who presents to the office today for 25-month follow-up.  She was last examined by [name], PA in 11/2023 and reported having baseline dyspnea on exertion which had overall been unchanged. She was walking for 15 minutes a day at least 3 days/week and denied any associated chest pain. Given no change in symptoms, further cardiac testing was not pursued and she was encouraged to follow-up in 6 months. Was continued on her current cardiac medications with Chlorthalidone  25 mg daily and Irbesartan  150 mg daily. In the interim, she sent a MyChart message requesting to stop Chlorthalidone  due to concerns this was contributing to elevated blood sugar.  In talking with the patient today, she reports overall feeling well since her last office visit but has been having elevated blood pressure at home. Her BP is at 172/90 during today's visit and this has been elevated at home with SBP sometimes in the 140's to 160's. In reviewing her medication list, she is only on Irbesartan  as she stopped Chlorthalidone  as outlined above but says that she stopped Amlodipine  due to not having refills on the medication and thought it was no longer needed. Has been under increased stress over the past year as her sister who had cancer was staying with her but passed away earlier this year. Her other sister had hip surgery several weeks ago. She denies any specific chest pain or progressive dyspnea on exertion. [place] No recent orthopnea, PND or pitting edema.  Studies Reviewed:   EKG: EKG is ordered today and demonstrates:   EKG Interpretation Date/Time:  Thursday June 08 2024 10:48:28  EDT Ventricular Rate:  66 PR Interval:  154 QRS Duration:  76 QT Interval:  390 QTC Calculation: 408 R Axis:   54  Text Interpretation: Normal sinus rhythm Nonspecific ST abnormality along inferior leads as noted on prior tracings. Confirmed by Johnson Grate ([id number]) on 06/08/2024 11:11:43 AM       Echocardiogram: 07/2015 Study Conclusions   - Left ventricle: The cavity size was normal. Wall thickness was    increased in a pattern of mild LVH. Systolic function was normal.    The estimated ejection fraction was in the range of 60% to 65%.    Wall motion was normal; there were no regional wall motion    abnormalities. Doppler parameters are consistent with abnormal    left ventricular relaxation (grade 1 diastolic dysfunction).    Indeterminate filling pressures.  - Aortic valve: Aortic valve sclerosis without stenosis. Mildly    thickened, mildly calcified leaflets.  - Left atrium: The atrium was mildly dilated. Volume/bsa, ES,    (1-plane Simpson&'s, A2C): 30 ml/m^2.   NST: 12/2015 Blood pressure demonstrated a hypertensive response to exercise. There was no ST segment deviation noted during stress. The study is normal. There are no perfusion defects. This is a low risk study. The left ventricular ejection fraction is hyperdynamic (>65%).   Physical Exam:   VS:  BP (!) 172/90   Pulse 66   Ht 5' 5 (1.651 m)   Wt 173 lb (78.5 kg)   SpO2 98%   BMI 28.79 kg/m    Wt  Readings from Last 3 Encounters:  06/08/24 173 lb (78.5 kg)  05/10/24 166 lb (75.3 kg)  04/14/24 178 lb (80.7 kg)     GEN: Pleasant, elderly female appearing in no acute distress NECK: No JVD; No carotid bruits CARDIAC: RRR, no murmurs, rubs, gallops RESPIRATORY:  Clear to auscultation without rales, wheezing or rhonchi  ABDOMEN: Appears non-distended. No obvious abdominal masses. EXTREMITIES: No clubbing or cyanosis. No pitting edema.  Distal pedal pulses are 2+ bilaterally.   Assessment and  Plan:   1. Essential hypertension - BP has been elevated as outlined above and suspect this is due to her stopping Amlodipine  due to not having refills and due to requesting to stop Chlorthalidone . Reviewed options with the patient and she will restart Amlodipine  at 5mg  daily while continuing Irbesartan  150mg  daily. She will keep a BP log and reach out with readings in 3-4 weeks. If BP remains above goal, would plan to titrate Amlodipine  to 10mg  daily.   Disposition: She will return a BP log in 3-4 weeks and may require medication changes based off results. She prefers to follow-up in 6 months unless clinically indicated in the interim.    Signed, Laymon CHRISTELLA Qua, PA-C

## 2024-06-08 ENCOUNTER — Encounter: Payer: Self-pay | Admitting: Student

## 2024-06-08 ENCOUNTER — Ambulatory Visit: Attending: Student | Admitting: Student

## 2024-06-08 VITALS — BP 172/90 | HR 66 | Ht 65.0 in | Wt 173.0 lb

## 2024-06-08 DIAGNOSIS — I10 Essential (primary) hypertension: Secondary | ICD-10-CM

## 2024-06-08 MED ORDER — AMLODIPINE BESYLATE 5 MG PO TABS: 5.0000 mg | ORAL_TABLET | Freq: Every day | ORAL | 3 refills | Status: AC

## 2024-06-08 NOTE — Patient Instructions (Signed)
 Medication Instructions:   Start Norvasc  5 mg Daily   Monitor Blood Pressure for one month and record the readings. Drop off at office when complete.   *If you need a refill on your cardiac medications before your next appointment, please call your pharmacy*  Lab Work: NONE   If you have labs (blood work) drawn today and your tests are completely normal, you will receive your results only by: MyChart Message (if you have MyChart) OR A paper copy in the mail If you have any lab test that is abnormal or we need to change your treatment, we will call you to review the results.  Testing/Procedures: NONE   Follow-Up: At [facility], you and your health needs are our priority.  As part of our continuing mission to provide you with exceptional heart care, our providers are all part of one team.  This team includes your primary Cardiologist (physician) and Advanced Practice Providers or APPs (Physician Assistants and Nurse Practitioners) who all work together to provide you with the care you need, when you need it.  Your next appointment:   6 month(s)  Provider:   Dorn Ross, MD or Laymon Qua, PA-C    We recommend signing up for the patient portal called MyChart.  Sign up information is provided on this After Visit Summary.  MyChart is used to connect with patients for Virtual Visits (Telemedicine).  Patients are able to view lab/test results, encounter notes, upcoming appointments, etc.  Non-urgent messages can be sent to your provider as well.   To learn more about what you can do with MyChart, go to [email or web address].   Other Instructions Thank you for choosing [facility]!

## 2024-06-19 ENCOUNTER — Telehealth: Payer: Self-pay | Admitting: Oncology

## 2024-06-23 ENCOUNTER — Other Ambulatory Visit: Payer: Self-pay | Admitting: Cardiology

## 2024-06-29 ENCOUNTER — Encounter: Payer: Self-pay | Admitting: Student

## 2024-06-30 ENCOUNTER — Ambulatory Visit: Payer: Self-pay | Admitting: Student

## 2024-09-14 ENCOUNTER — Ambulatory Visit: Admitting: Family Medicine

## 2024-09-20 ENCOUNTER — Encounter: Payer: Self-pay | Admitting: Nurse Practitioner

## 2024-09-20 ENCOUNTER — Ambulatory Visit: Admitting: Nurse Practitioner

## 2024-09-20 VITALS — BP 136/82 | HR 79 | Ht 65.0 in | Wt 176.0 lb

## 2024-09-20 DIAGNOSIS — Z853 Personal history of malignant neoplasm of breast: Secondary | ICD-10-CM

## 2024-09-20 DIAGNOSIS — E119 Type 2 diabetes mellitus without complications: Secondary | ICD-10-CM | POA: Insufficient documentation

## 2024-09-20 DIAGNOSIS — K2100 Gastro-esophageal reflux disease with esophagitis, without bleeding: Secondary | ICD-10-CM

## 2024-09-20 DIAGNOSIS — Z6829 Body mass index (BMI) 29.0-29.9, adult: Secondary | ICD-10-CM

## 2024-09-20 DIAGNOSIS — M8580 Other specified disorders of bone density and structure, unspecified site: Secondary | ICD-10-CM

## 2024-09-20 DIAGNOSIS — E1169 Type 2 diabetes mellitus with other specified complication: Secondary | ICD-10-CM

## 2024-09-20 DIAGNOSIS — I10 Essential (primary) hypertension: Secondary | ICD-10-CM

## 2024-09-20 DIAGNOSIS — Z7984 Long term (current) use of oral hypoglycemic drugs: Secondary | ICD-10-CM

## 2024-09-20 DIAGNOSIS — I5032 Chronic diastolic (congestive) heart failure: Secondary | ICD-10-CM

## 2024-09-20 MED ORDER — OMEPRAZOLE 20 MG PO CPDR: 20.0000 mg | DELAYED_RELEASE_CAPSULE | Freq: Every day | ORAL | 1 refills | Status: AC | PRN

## 2024-09-20 NOTE — Patient Instructions (Signed)

## 2024-09-20 NOTE — Progress Notes (Signed)
 "  Subjective:    Patient ID: Susan Beck Husband, adult    DOB: [birth year], 89 y.o.   MRN: [id number]   Chief Complaint: Medical Management of Chronic Issues (Five month follow up )    HPI:  Susan Beck is a 89 y.o. who identifies as a female who was assigned adult at birth.   Social history: Lives with: alone- family check son her weekly Work history: retired   Water Engineer in today for follow up of the following chronic medical issues:  1. Essential (primary) hypertension assocaited with diabetes No c.o chest pain, sob or headache Does check blood pressure at home- 130 systolic BP Readings from Last 3 Encounters:  09/20/24 (!) 167/67  06/08/24 (!) 172/90  05/10/24 (!) 185/62     2. Chronic diastolic (congestive) heart failure (HCC) Saw cardiology 06/08/24-   Stop chlorathalidone Added Amlodipipine 5mg  daily and was to continue on losartan150mg  daily  3. Gastroesophageal reflux disease with esophagitis without hemorrhage Is on omperazole dialy No recent heart burn  4. History of breast cancer Sees oncology yearly No reoccurence  5. Osteopenia, unspecified location Last dexascan was 05/25/24- t score was -2.6 Takes daily  multivitamin Some weight bearing exercises  6. Diabetes type 2 with other specified complications She doe snot check blood sugars at home She has not been taking metformin  as prescribed Has been trying to watch carbs so she wont have to take meds. Lab Results  Component Value Date   HGBA1C 6.6 (H) 04/17/2024    7. Hypokalemia Is on potassium supplement 2x a day No c/o muscle cramping  8. BMi 29.0-29.9  Wt Readings from Last 3 Encounters:  09/20/24 176 lb (79.8 kg)  06/08/24 173 lb (78.5 kg)  05/10/24 166 lb (75.3 kg)   BMI Readings from Last 3 Encounters:  09/20/24 29.29 kg/m  06/08/24 28.79 kg/m  05/10/24 27.62 kg/m    New complaints: None today  Allergies[1] Outpatient Encounter Medications as of 09/20/2024  Medication Sig    amLODipine  (NORVASC ) 5 MG tablet Take 1 tablet (5 mg total) by mouth daily.   anastrozole  (ARIMIDEX ) 1 MG tablet Take 1 tablet (1 mg total) by mouth daily.   Calcium Carbonate-Vitamin D  (CALTRATE 600+D PO) Take by mouth daily.   irbesartan  (AVAPRO ) 150 MG tablet Take 1 tablet (150 mg total) by mouth daily.   Multiple Vitamin (MULTIVITAMIN WITH MINERALS) TABS tablet Take 1 tablet by mouth daily. Centrum Silver   omeprazole  (PRILOSEC) 20 MG capsule Take 1 capsule (20 mg total) by mouth daily as needed.   potassium chloride  SA (KLOR-CON  M) 20 MEQ tablet TAKE 2 TABLETS BY MOUTH DAILY.   metFORMIN  (GLUCOPHAGE ) 500 MG tablet Take 1 tablet (500 mg total) by mouth daily. (Patient not taking: Reported on 09/20/2024)   No facility-administered encounter medications on file as of 09/20/2024.    Past Surgical History:  Procedure Laterality Date   APPENDECTOMY     BREAST BIOPSY Left 02/13/2020   left breast invasive lobular carcinoma   CATARACT EXTRACTION, BILATERAL     HERNIA REPAIR     MASTECTOMY Left 03/25/2020   MASTECTOMY MODIFIED RADICAL Left 03/25/2020   Procedure: MASTECTOMY MODIFIED RADICAL;  Surgeon: Mavis Anes, MD;  Location: [facility];  Service: General;  Laterality: Left;   SMALL INTESTINE SURGERY     THYROIDECTOMY      Family History  Problem Relation Age of Onset   Breast cancer Sister    Heart disease Paternal Grandmother  Review of Systems  Constitutional:  Negative for diaphoresis.  Eyes:  Negative for pain.  Respiratory:  Negative for shortness of breath.   Cardiovascular:  Negative for chest pain, palpitations and leg swelling.  Gastrointestinal:  Negative for abdominal pain.  Endocrine: Negative for polydipsia.  Skin:  Negative for rash.  Neurological:  Negative for dizziness, weakness and headaches.  Hematological:  Does not bruise/bleed easily.  All other systems reviewed and are negative.      Objective:   Physical Exam Vitals and nursing note  reviewed.  Constitutional:      General: He is not in acute distress.    Appearance: Normal appearance. He is well-developed.  HENT:     Head: Normocephalic.     Right Ear: Tympanic membrane normal.     Left Ear: Tympanic membrane normal.     Nose: Nose normal.     Mouth/Throat:     Mouth: Mucous membranes are moist.  Eyes:     Pupils: Pupils are equal, round, and reactive to light.  Neck:     Vascular: No carotid bruit or JVD.  Cardiovascular:     Rate and Rhythm: Normal rate and regular rhythm.     Heart sounds: Normal heart sounds.  Pulmonary:     Effort: Pulmonary effort is normal. No respiratory distress.     Breath sounds: Normal breath sounds. No wheezing or rales.  Chest:     Chest wall: No tenderness.  Abdominal:     General: Bowel sounds are normal. There is no distension or abdominal bruit.     Palpations: Abdomen is soft. There is no hepatomegaly, splenomegaly, mass or pulsatile mass.     Tenderness: There is no abdominal tenderness.  Musculoskeletal:        General: Normal range of motion.     Cervical back: Normal range of motion and neck supple.  Lymphadenopathy:     Cervical: No cervical adenopathy.  Skin:    General: Skin is warm and dry.  Neurological:     Mental Status: He is alert and oriented to person, place, and time.     Deep Tendon Reflexes: Reflexes are normal and symmetric.  Psychiatric:        Behavior: Behavior normal.        Thought Content: Thought content normal.        Judgment: Judgment normal.     BP 136/82   Pulse 79   Ht 5' 5 (1.651 m)   Wt 176 lb (79.8 kg)   SpO2 99%   BMI 29.29 kg/m         Assessment & Plan:  Susan Beck comes in today with chief complaint of Medical Management of Chronic Issues (Five month follow up )   Diagnosis and orders addressed:  1. Essential (primary) hypertension (Primary) Continue amlodipine  and irbesartan  Continue to keep a diary of blood pressures at home - CBC with  Differential/Platelet - CMP14+EGFR - Lipid panel  2. Chronic diastolic (congestive) heart failure (HCC) Keep follow up with cardiology   3. Gastroesophageal reflux disease with esophagitis without hemorrhage Avoid spicy foods Do not eat 2 hours prior to bedtime - omeprazole  (PRILOSEC) 20 MG capsule; Take 1 capsule (20 mg total) by mouth daily as needed.  Dispense: 90 capsule; Refill: 1  4. History of breast cancer Keep followup with oncology  5. Osteopenia, unspecified location Weight bearing eercises as can tolerate  6. Type 2 diabetes mellitus with other specified complication, without long-term current use  of insulin (HCC) Carb counting' prescription sent in for glucometer - For home use only DME Glucometer - Bayer DCA Hb A1c Waived  7. BMI 29.0-29.9,adult Discussed diet and exercise for person with BMI >25 Will recheck weight in 3-6 months    Labs pending Health Maintenance reviewed Diet and exercise encouraged  Follow up plan: 6 months   Mary-Margaret Gladis, FNP      [1] No Known Allergies  "

## 2024-09-21 ENCOUNTER — Ambulatory Visit: Payer: Self-pay | Admitting: Nurse Practitioner

## 2024-09-21 LAB — CBC WITH DIFFERENTIAL/PLATELET
Basophils Absolute: 0.1 x10E3/uL (ref 0.0–0.2)
Basos: 1 %
EOS (ABSOLUTE): 0.1 x10E3/uL (ref 0.0–0.4)
Eos: 2 %
Hematocrit: 42.8 % (ref 34.0–46.6)
Hemoglobin: 14.2 g/dL (ref 11.1–15.9)
Immature Grans (Abs): 0 x10E3/uL (ref 0.0–0.1)
Immature Granulocytes: 0 %
Lymphocytes Absolute: 1.7 x10E3/uL (ref 0.7–3.1)
Lymphs: 28 %
MCH: 29.8 pg (ref 26.6–33.0)
MCHC: 33.2 g/dL (ref 31.5–35.7)
MCV: 90 fL (ref 79–97)
Monocytes Absolute: 0.6 x10E3/uL (ref 0.1–0.9)
Monocytes: 9 %
Neutrophils Absolute: 3.7 x10E3/uL (ref 1.4–7.0)
Neutrophils: 60 %
Platelets: 290 x10E3/uL (ref 150–450)
RBC: 4.76 x10E6/uL (ref 3.77–5.28)
RDW: 14.5 % (ref 11.7–15.4)
WBC: 6.1 x10E3/uL (ref 3.4–10.8)

## 2024-09-21 LAB — CMP14+EGFR
ALT: 15 IU/L (ref 0–32)
AST: 14 IU/L (ref 0–40)
Albumin: 4.3 g/dL (ref 3.6–4.6)
Alkaline Phosphatase: 86 IU/L (ref 48–129)
BUN/Creatinine Ratio: 16 (ref 12–28)
BUN: 16 mg/dL (ref 10–36)
Bilirubin Total: 0.4 mg/dL (ref 0.0–1.2)
CO2: 20 mmol/L (ref 20–29)
Calcium: 10.1 mg/dL (ref 8.7–10.3)
Chloride: 104 mmol/L (ref 96–106)
Creatinine, Ser: 1.03 mg/dL — ABNORMAL HIGH (ref 0.57–1.00)
Globulin, Total: 3.1 g/dL (ref 1.5–4.5)
Glucose: 91 mg/dL (ref 70–99)
Potassium: 4.6 mmol/L (ref 3.5–5.2)
Sodium: 140 mmol/L (ref 134–144)
Total Protein: 7.4 g/dL (ref 6.0–8.5)
eGFR: 52 mL/min/1.73 — ABNORMAL LOW

## 2024-09-21 LAB — LIPID PANEL
Chol/HDL Ratio: 4 ratio (ref 0.0–4.4)
Cholesterol, Total: 155 mg/dL (ref 100–199)
HDL: 39 mg/dL — ABNORMAL LOW
LDL Chol Calc (NIH): 87 mg/dL (ref 0–99)
Triglycerides: 167 mg/dL — ABNORMAL HIGH (ref 0–149)
VLDL Cholesterol Cal: 29 mg/dL (ref 5–40)

## 2024-10-31 ENCOUNTER — Inpatient Hospital Stay

## 2024-11-07 ENCOUNTER — Inpatient Hospital Stay: Admitting: Physician Assistant

## 2024-11-28 ENCOUNTER — Ambulatory Visit: Admitting: Cardiology

## 2025-03-14 ENCOUNTER — Ambulatory Visit: Payer: Self-pay | Admitting: Nurse Practitioner
